# Patient Record
Sex: Male | Born: 1980 | Race: White | Hispanic: No | Marital: Single | Smoking: Current every day smoker
Health system: Southern US, Community
[De-identification: ages and names within clinical notes are randomized; demographics above are authoritative.]

## PROBLEM LIST (undated history)

## (undated) DIAGNOSIS — F1410 Cocaine abuse, uncomplicated: Secondary | ICD-10-CM

## (undated) DIAGNOSIS — F411 Generalized anxiety disorder: Secondary | ICD-10-CM

## (undated) DIAGNOSIS — K649 Unspecified hemorrhoids: Secondary | ICD-10-CM

## (undated) DIAGNOSIS — F988 Other specified behavioral and emotional disorders with onset usually occurring in childhood and adolescence: Secondary | ICD-10-CM

---

## 2002-07-18 ENCOUNTER — Emergency Department (HOSPITAL_COMMUNITY): Admission: EM | Admit: 2002-07-18 | Discharge: 2002-07-19 | Payer: Self-pay | Admitting: *Deleted

## 2002-07-20 ENCOUNTER — Encounter: Admission: RE | Admit: 2002-07-20 | Discharge: 2002-07-20 | Payer: Self-pay | Admitting: Family Medicine

## 2002-07-20 ENCOUNTER — Encounter: Payer: Self-pay | Admitting: Family Medicine

## 2003-11-27 DIAGNOSIS — Z8619 Personal history of other infectious and parasitic diseases: Secondary | ICD-10-CM

## 2003-11-27 HISTORY — DX: Personal history of other infectious and parasitic diseases: Z86.19

## 2003-12-12 ENCOUNTER — Ambulatory Visit (HOSPITAL_BASED_OUTPATIENT_CLINIC_OR_DEPARTMENT_OTHER): Admission: RE | Admit: 2003-12-12 | Discharge: 2003-12-12 | Payer: Self-pay | Admitting: Urology

## 2003-12-12 ENCOUNTER — Encounter (INDEPENDENT_AMBULATORY_CARE_PROVIDER_SITE_OTHER): Payer: Self-pay | Admitting: Specialist

## 2003-12-12 HISTORY — PX: URETHROPLASTY: SHX499

## 2007-10-28 ENCOUNTER — Emergency Department (HOSPITAL_COMMUNITY): Admission: EM | Admit: 2007-10-28 | Discharge: 2007-10-29 | Payer: Self-pay | Admitting: Emergency Medicine

## 2011-03-13 NOTE — Op Note (Signed)
NAME:  [name], Frank Huerta                            ACCOUNT NO.:  [id number]   MEDICAL RECORD NO.:  [id number]                   PATIENT TYPE:  AMB   LOCATION:  [facility]                                 FACILITY:  [facility]   PHYSICIAN:  Sigmund I. Patsi Sears, M.D.         DATE OF BIRTH:  1981-05-09   DATE OF PROCEDURE:  12/12/2003  DATE OF DISCHARGE:                                 OPERATIVE REPORT   PREOPERATIVE DIAGNOSIS:  Urethral sub meatal wart.   POSTOPERATIVE DIAGNOSIS:  Urethral sub meatal wart.   OPERATION:  1. Meatotomy.  2. Distal urethral excision of large wart.  3. Cystoscopy.  4. Urethroplasty, Byars flaps.   SURGEON:  Sigmund I. Patsi Sears, M.D.   ASSISTANTTyson Alias, N.P.-C.   PREPARATION:  After appropriate preanesthesia, the patient is brought to the  operating room and placed on the operating table in dorsal supine position  where general LMA anesthesia was introduced.  He remained in this position  where the pubis was prepped with Betadine solution and draped in the usual  fashion.   HISTORY:  This 30 year old male (see office note of November 28, 2003), has a  large intraurethral wart, in the sub meatal area, which can be identified by  peeling back the glans but cannot be completely exposed by this.  It was  felt that the patient needs meatotomy and excision of the wart.  He was seen  in second opinion by Dr. Etta Grandchild, who was in agreement with surgical plans  after consideration of all nonoperative treatments.   DESCRIPTION OF PROCEDURE:  After prepping and draping of the penis,  meatotomy was made, and urethra was incised.  Four retracting sutures are  placed in the glans, and bleeding was corrected with the electrosurgical  unit and sponging.  A very large wart was identified on both sides of the  urethra, requiring wide excision of the urethra at the level of the sub  meatal urethra.  Urethroplasty was then accomplished with 5-0 Monocryl  sutures with simple  interrupted sutures.  Following this, cystoscopy was  accomplished and showed no evidence of wart anywhere else in the urethra or  the bladder.  There was clear efflux in the orifices, and there was no  evidence of bladder outlet obstruction.  There was no trabeculation, no  cellules, no diverticular formation, no stones, and no tumors.   A 16 French silicone catheter was inserted in the bladder.  I considered  suprapubic catheterization but elected for a short course of Foley  catheterization postoperatively.  Following Foley catheter insertion,  meatoplasty was accomplished around the Foley, and Byars flaps were then  brought back around the penis.  Frenular incision had previously been  accomplished at the beginning of the case, and the frenulum was then  reattached with a 5-0 Vicryl  suture.  The patient had an excellent-appearing penis at the end of the  procedure, and Neosporin  ointment was placed on the meatus and the external  incision.  Sterile dressing was applied.  The patient was given IV Toradol  and awakened and taken to the recovery room in good condition.                                               Sigmund I. Patsi Sears, M.D.    SIT/MEDQ  D:  12/12/2003  T:  12/12/2003  Job:  [id number]

## 2017-04-06 ENCOUNTER — Ambulatory Visit
Admission: RE | Admit: 2017-04-06 | Discharge: 2017-04-06 | Disposition: A | Discharge status: Home / Self Care | Payer: No Typology Code available for payment source | Source: Ambulatory Visit | Attending: Internal Medicine | Admitting: Internal Medicine

## 2017-04-06 ENCOUNTER — Other Ambulatory Visit: Payer: Self-pay | Admitting: Internal Medicine

## 2017-04-06 DIAGNOSIS — R0789 Other chest pain: Secondary | ICD-10-CM

## 2017-04-06 DIAGNOSIS — M545 Low back pain: Secondary | ICD-10-CM

## 2017-05-01 ENCOUNTER — Encounter (HOSPITAL_COMMUNITY): Payer: Self-pay

## 2017-05-01 ENCOUNTER — Emergency Department (HOSPITAL_COMMUNITY): Payer: Self-pay

## 2017-05-01 ENCOUNTER — Emergency Department (HOSPITAL_COMMUNITY)
Admission: EM | Admit: 2017-05-01 | Discharge: 2017-05-01 | Disposition: A | Discharge status: Home / Self Care | Payer: Self-pay | Attending: Emergency Medicine | Admitting: Emergency Medicine

## 2017-05-01 DIAGNOSIS — R079 Chest pain, unspecified: Secondary | ICD-10-CM | POA: Insufficient documentation

## 2017-05-01 DIAGNOSIS — R002 Palpitations: Secondary | ICD-10-CM | POA: Insufficient documentation

## 2017-05-01 DIAGNOSIS — R42 Dizziness and giddiness: Secondary | ICD-10-CM | POA: Insufficient documentation

## 2017-05-01 DIAGNOSIS — F17290 Nicotine dependence, other tobacco product, uncomplicated: Secondary | ICD-10-CM | POA: Insufficient documentation

## 2017-05-01 LAB — BASIC METABOLIC PANEL
ANION GAP: 12 (ref 5–15)
BUN: 9 mg/dL (ref 6–20)
CALCIUM: 9.5 mg/dL (ref 8.9–10.3)
CO2: 23 mmol/L (ref 22–32)
CREATININE: 1.11 mg/dL (ref 0.61–1.24)
Chloride: 102 mmol/L (ref 101–111)
Glucose, Bld: 108 mg/dL — ABNORMAL HIGH (ref 65–99)
Potassium: 5.1 mmol/L (ref 3.5–5.1)
SODIUM: 137 mmol/L (ref 135–145)

## 2017-05-01 LAB — I-STAT TROPONIN, ED
TROPONIN I, POC: 0 ng/mL (ref 0.00–0.08)
Troponin i, poc: 0 ng/mL (ref 0.00–0.08)

## 2017-05-01 LAB — CBC
HCT: 44.7 % (ref 39.0–52.0)
HEMOGLOBIN: 15.4 g/dL (ref 13.0–17.0)
MCH: 32.5 pg (ref 26.0–34.0)
MCHC: 34.5 g/dL (ref 30.0–36.0)
MCV: 94.3 fL (ref 78.0–100.0)
PLATELETS: 212 10*3/uL (ref 150–400)
RBC: 4.74 MIL/uL (ref 4.22–5.81)
RDW: 13.5 % (ref 11.5–15.5)
WBC: 11.3 10*3/uL — AB (ref 4.0–10.5)

## 2017-05-01 LAB — RAPID URINE DRUG SCREEN, HOSP PERFORMED
Amphetamines: NOT DETECTED
BENZODIAZEPINES: NOT DETECTED
Barbiturates: NOT DETECTED
COCAINE: POSITIVE — AB
OPIATES: NOT DETECTED
Tetrahydrocannabinol: NOT DETECTED

## 2017-05-01 MED ORDER — NITROGLYCERIN 0.4 MG/SPRAY TL SOLN
1.0000 | Freq: Once | Status: AC
  Administered 2017-05-01: 1 via SUBLINGUAL
  Filled 2017-05-01 (×2): qty 4.9

## 2017-05-01 NOTE — ED Notes (Signed)
Patient transported to X-ray 

## 2017-05-01 NOTE — ED Notes (Signed)
Pt updated to plan of care and delay. Plan to draw troponin at approx 10:15 PM.

## 2017-05-01 NOTE — Discharge Instructions (Signed)
Refrain from using cocaine again as it could cause the symptoms to return, causing to have a heart attack, or even death. Please return to the emergency department if you develop any new or worsening symptoms.

## 2017-05-01 NOTE — ED Notes (Signed)
ED Provider at bedside. 

## 2017-05-01 NOTE — ED Notes (Signed)
Per Morrie SheldonAshley, lab tech, tech ran blood gas instead of I stat trop @ 1624, need to re-draw lab. Advised Bernell ListKaleigh Brown RN.

## 2017-05-01 NOTE — ED Provider Notes (Signed)
Patient continues to feel well and Dr. baseline. Sign out from Mathews RobinsonsJessica Mitchell, PA-C  Patient is a previously healthy 36 year old male who presents with left-sided chest pain and numbness from his left neck to hip after using cocaine earlier this morning from midnight to 4 AM. Patient symptoms began around 10 AM. Patient was given nitroglycerin in route and in the ED. Initial troponin is negative. Other labs are unremarkable. UDS shows cocaine, but no other findings. Delta troponin is pending as well as observation of the patient's symptoms.  On my evaluation, patient's symptoms are resolved and he is feeling back to baseline. After anticipated negative delta troponin, plan to discharge patient home.   10:30p delta troponin is negative. Patient continues to feel well and back to baseline. Patient advised not to use cocaine or other drugs anymore, and he states he will never use drugs again. Return precautions discussed. Patient understands and agrees with plan. Patient vitals stable to ED course discharged in satisfactory condition.   Emi HolesLaw, Wang Granada M, PA-C 05/01/17 2229    Clarene DukeLittle, Ambrose Finlandachel Morgan, MD 05/02/17 [phone]

## 2017-05-01 NOTE — ED Triage Notes (Signed)
Pt via EMS with L sided chest tightness radiating to L shoulder x 1 hour. Pt reports approx 0.5g of cocaine use this morning between 12-4 AM and has not been asleep since. Pt A&Ox4. Denies SOB, N/V. Pt reports lightheadedness and reports he feels as though "he might pass out". 180/88, 125 bpm, 96% on RA, RR 18. 324 ASA and 1 NTG en route with no relief.

## 2017-05-01 NOTE — ED Provider Notes (Signed)
[facility]-EMERGENCY DEPT Provider Note   CSN: [id number] Arrival date & time: 05/01/17  1613     History   Chief Complaint Chief Complaint  Patient presents with  . Chest Pain    HPI Frank Huerta is a 36 y.o. male with no past medical history presenting via EMS with sudden onset chest pressure since 1:00 this afternoon. Patient reports that he was using cocaine from 12 to 4 AM this morning. He did not sleep and around 10 AM he felt lightheaded started having a burning sensation on his left chest and torso up to his neck. He then began to experience palpitations. He reports having had constant chest pressure like someone is sitting on his chest since 1 PM. He did not get any relief from nitroglycerin by EMS. He states that he feels like he can't lift his left arm all the way up without pain. Patient reports having had cocaine in the past but never experiencing any symptoms like this. Denies associated diaphoresis, nausea, vomiting, difficulty breathing.  HPI  History reviewed. No pertinent past medical history.  There are no active problems to display for this patient.   No past surgical history on file.     Home Medications    Prior to Admission medications   Not on File    Family History No family history on file.  Social History Social History  Substance Use Topics  . Smoking status: Current Every Day Smoker    Types: E-cigarettes  . Smokeless tobacco: Never Used  . Alcohol use Yes     Comment: three times weekly     Allergies   Patient has no known allergies.   Review of Systems Review of Systems  Constitutional: Negative for chills and fever.  HENT: Negative for ear pain and sore throat.   Eyes: Negative for pain and visual disturbance.  Respiratory: Positive for chest tightness. Negative for cough and shortness of breath.   Cardiovascular: Positive for chest pain and palpitations. Negative for leg swelling.  Gastrointestinal: Positive for abdominal  distention. Negative for abdominal pain, nausea and vomiting.       Patient reports getting distended every time he does cocaine  Genitourinary: Negative for dysuria and hematuria.  Musculoskeletal: Negative for arthralgias, back pain, neck pain and neck stiffness.  Skin: Negative for color change, pallor and rash.  Neurological: Positive for light-headedness. Negative for dizziness, seizures, syncope, facial asymmetry, speech difficulty, weakness, numbness and headaches.     Physical Exam Updated Vital Signs BP (!) 153/85   Pulse 75   Temp 98.8 F (37.1 C) (Oral)   Resp 17   Ht 6\' 4"  (1.93 m)   Wt 93 kg (205 lb)   SpO2 95%   BMI 24.95 kg/m   Physical Exam  Constitutional: He appears well-developed and well-nourished. No distress.  Afebrile, nontoxic-appearing, lying comfortably in bed in no acute distress.  HENT:  Head: Normocephalic and atraumatic.  Eyes: Conjunctivae and EOM are normal. Pupils are equal, round, and reactive to light.  Neck: Normal range of motion. Neck supple.  Cardiovascular: Normal rate, regular rhythm, normal heart sounds and intact distal pulses.   No murmur heard. Pulmonary/Chest: Effort normal and breath sounds normal. No respiratory distress. He has no wheezes. He has no rales. He exhibits tenderness.  Abdominal: Soft. He exhibits distension. He exhibits no mass. There is no tenderness. There is no rebound and no guarding.  Musculoskeletal: He exhibits tenderness. He exhibits no edema or deformity.  Decreased range of  motion of the left shoulder due to pain patient is unable to live beyond the level of his shoulders. Left chest tenderness  Neurological: He is alert.  Skin: Skin is warm and dry. No rash noted. He is not diaphoretic. No erythema. No pallor.  Psychiatric: He has a normal mood and affect.  Nursing note and vitals reviewed.    ED Treatments / Results  Labs (all labs ordered are listed, but only abnormal results are displayed) Labs  Reviewed  BASIC METABOLIC PANEL - Abnormal; Notable for the following:       Result Value   Glucose, Bld 108 (*)    All other components within normal limits  CBC - Abnormal; Notable for the following:    WBC 11.3 (*)    All other components within normal limits  RAPID URINE DRUG SCREEN, HOSP PERFORMED  I-STAT TROPOININ, ED    EKG  EKG Interpretation  Date/Time:  Saturday May 01 2017 16:22:40 EDT Ventricular Rate:  90 PR Interval:    QRS Duration: 81 QT Interval:  344 QTC Calculation: 421 R Axis:   84 Text Interpretation:  Sinus rhythm Probable left atrial enlargement T wave inversion III no previous available Confirmed by Frederick Peers [phone]) on 05/01/2017 6:04:58 PM       Radiology Dg Chest 2 View  Result Date: 05/01/2017 CLINICAL DATA:  Chest pressure and pain. EXAM: CHEST  2 VIEW COMPARISON:  April 06, 2017 FINDINGS: The heart size and mediastinal contours are within normal limits. Both lungs are clear. The visualized skeletal structures are unremarkable. IMPRESSION: No active cardiopulmonary disease. Electronically Signed   By: Gerome Sam III M.D   On: 05/01/2017 18:35    Procedures Procedures (including critical care time)  Medications Ordered in ED Medications  nitroGLYCERIN (NITROLINGUAL) 0.4 MG/SPRAY spray 1 spray (1 spray Sublingual Given 05/01/17 1841)     Initial Impression / Assessment and Plan / ED Course  I have reviewed the triage vital signs and the nursing notes.  Pertinent labs & imaging results that were available during my care of the patient were reviewed by me and considered in my medical decision making (see chart for details).    Patient presents with chest pain possibly vasospastic angina from cocaine use. Although his last intake was > 6 hours prior to onset of symptoms.  No past medical history, otherwise healthy and in his usual state of health until this episode. Patient denies any prior episode after cocaine use. Heart score:  1 Initial troponin negative.  Patient reported improvement in symptoms while in ED.  Transferred patient care at end of shift to Glenford Bayley, PA-C pending delta troponin. Anticipate discharge home if negative. Encourage substance abuse counseling.   Final Clinical Impressions(s) / ED Diagnoses   Final diagnoses:  Nonspecific chest pain    New Prescriptions New Prescriptions   No medications on file     Gregary Cromer 05/01/17 2038    Little, Ambrose Finland, MD 05/02/17 [phone]

## 2017-09-30 ENCOUNTER — Emergency Department (HOSPITAL_COMMUNITY)
Admission: EM | Admit: 2017-09-30 | Discharge: 2017-09-30 | Disposition: A | Discharge status: Home / Self Care | Payer: Self-pay | Attending: Physician Assistant | Admitting: Physician Assistant

## 2017-09-30 ENCOUNTER — Emergency Department (HOSPITAL_COMMUNITY): Payer: Self-pay

## 2017-09-30 ENCOUNTER — Encounter (HOSPITAL_COMMUNITY): Payer: Self-pay | Admitting: *Deleted

## 2017-09-30 DIAGNOSIS — F909 Attention-deficit hyperactivity disorder, unspecified type: Secondary | ICD-10-CM | POA: Insufficient documentation

## 2017-09-30 DIAGNOSIS — F17200 Nicotine dependence, unspecified, uncomplicated: Secondary | ICD-10-CM | POA: Insufficient documentation

## 2017-09-30 DIAGNOSIS — R0789 Other chest pain: Secondary | ICD-10-CM | POA: Insufficient documentation

## 2017-09-30 DIAGNOSIS — F1410 Cocaine abuse, uncomplicated: Secondary | ICD-10-CM | POA: Insufficient documentation

## 2017-09-30 HISTORY — DX: Cocaine abuse, uncomplicated: F14.10

## 2017-09-30 HISTORY — DX: Other specified behavioral and emotional disorders with onset usually occurring in childhood and adolescence: F98.8

## 2017-09-30 LAB — BASIC METABOLIC PANEL
Anion gap: 13 (ref 5–15)
BUN: 12 mg/dL (ref 6–20)
CALCIUM: 9.4 mg/dL (ref 8.9–10.3)
CHLORIDE: 104 mmol/L (ref 101–111)
CO2: 20 mmol/L — ABNORMAL LOW (ref 22–32)
CREATININE: 0.98 mg/dL (ref 0.61–1.24)
Glucose, Bld: 110 mg/dL — ABNORMAL HIGH (ref 65–99)
Potassium: 3.5 mmol/L (ref 3.5–5.1)
SODIUM: 137 mmol/L (ref 135–145)

## 2017-09-30 LAB — CBC
HCT: 42.1 % (ref 39.0–52.0)
Hemoglobin: 15.2 g/dL (ref 13.0–17.0)
MCH: 33.3 pg (ref 26.0–34.0)
MCHC: 36.1 g/dL — ABNORMAL HIGH (ref 30.0–36.0)
MCV: 92.1 fL (ref 78.0–100.0)
Platelets: 173 10*3/uL (ref 150–400)
RBC: 4.57 MIL/uL (ref 4.22–5.81)
RDW: 13.3 % (ref 11.5–15.5)
WBC: 8.7 10*3/uL (ref 4.0–10.5)

## 2017-09-30 LAB — I-STAT TROPONIN, ED
Troponin i, poc: 0 ng/mL (ref 0.00–0.08)
Troponin i, poc: 0 ng/mL (ref 0.00–0.08)

## 2017-09-30 MED ORDER — SODIUM CHLORIDE 0.9 % IV BOLUS (SEPSIS)
1000.0000 mL | Freq: Once | INTRAVENOUS | Status: AC
  Administered 2017-09-30: 1000 mL via INTRAVENOUS

## 2017-09-30 MED ORDER — ACETAMINOPHEN 325 MG PO TABS
325.0000 mg | ORAL_TABLET | Freq: Once | ORAL | Status: AC
  Administered 2017-09-30: 325 mg via ORAL
  Filled 2017-09-30: qty 1

## 2017-09-30 NOTE — ED Notes (Signed)
Patient requesting to use his own nicotine patch. EDP aware and approved

## 2017-09-30 NOTE — ED Triage Notes (Signed)
Per EMS, pt from home, reports severe L sided cp radiating to his L shoulder and L arm.  Reports doing 0.5gm of cocaine at 0200, started to have severe cp at 0500 and has worsened since.  Pt describes pain as pressure, heavy pounding pain.  Pt reports same happened in July but took more than this am.  Pt is calm and cooperative.  Denies any SOB/dizziness.  Reports mild h/a.  Pt reports laying flat makes pain worse, sitting up makes it better.

## 2017-09-30 NOTE — ED Provider Notes (Signed)
[facility] EMERGENCY DEPARTMENT Provider Note   CSN: [id number] Arrival date & time: 09/30/17  1143     History   Chief Complaint Chief Complaint  Patient presents with  . Chest Pain  . cocaine abuse    HPI Frank Huerta is a 36 y.o. male.  HPI  Mr. Frank Huerta is a 36 year old male with a history of cocaine abuse, ADHD who presents emergency department for evaluation of left-sided chest pain after using cocaine earlier today.  Patient states that he used 0.5 g of cocaine around 2AM today.  Reports that he began having severe 8/10  left-sided chest pain at approximately 5AM which radiates to the left arm.  Pain feels like a "thumping sensation in my chest."  Pain is worsened with lying flat or with lifting the left arm above the head.  Denies associated shortness of breath, nausea/vomiting, diaphoresis.  States that his left arm feels tingly, but denies loss of sensation.  He has not tried any medications to help alleviate the pain.  States that his symptoms are identical to the symptoms he had in July when he abused cocaine. He is asking for IV fluids.  He denies weakness, dysarthria, dysphagia, difficulty walking, visual disturbance.   Past Medical History:  Diagnosis Date  . Attention deficit disorder (ADD)   . Cocaine abuse (HCC)     There are no active problems to display for this patient.   History reviewed. No pertinent surgical history.     Home Medications    Prior to Admission medications   Not on File    Family History No family history on file.  Social History Social History   Tobacco Use  . Smoking status: Current Every Day Smoker    Types: E-cigarettes  . Smokeless tobacco: Never Used  Substance Use Topics  . Alcohol use: Yes    Comment: three times weekly  . Drug use: Yes    Types: Cocaine     Allergies   Patient has no known allergies.   Review of Systems Review of Systems  Constitutional: Negative for chills, fatigue and  fever.  HENT: Negative for trouble swallowing.   Eyes: Negative for visual disturbance.  Respiratory: Negative for cough and shortness of breath.   Cardiovascular: Positive for chest pain and palpitations. Negative for leg swelling.  Gastrointestinal: Negative for abdominal pain, nausea and vomiting.  Genitourinary: Negative for difficulty urinating.  Musculoskeletal: Positive for myalgias (left arm pain). Negative for gait problem.  Skin: Negative for rash.  Neurological: Negative for dizziness, speech difficulty, weakness, light-headedness, numbness (tingling sensation in left arm, no loss of sensation) and headaches.  Psychiatric/Behavioral: Negative for agitation.     Physical Exam Updated Vital Signs BP 131/84   Pulse 74   Temp 98.3 F (36.8 C) (Oral)   Resp 19   Ht 6\' 4"  (1.93 m)   Wt 93 kg (205 lb)   SpO2 98%   BMI 24.95 kg/m   Physical Exam  Constitutional: He is oriented to person, place, and time. He appears well-developed and well-nourished. No distress.  HENT:  Head: Normocephalic and atraumatic.  Eyes: EOM are normal. Pupils are equal, round, and reactive to light. Right eye exhibits no discharge. Left eye exhibits no discharge.  Neck: Normal range of motion. Neck supple. No JVD present. No tracheal deviation present.  Cardiovascular: Normal rate and regular rhythm. Exam reveals no friction rub.  No murmur heard. Pulmonary/Chest: Effort normal and breath sounds normal. No respiratory distress.  He has no wheezes. He has no rhonchi. He has no rales.  Tender to palpation grossly over the left chest wall, no overlying rash, erythema or ecchymosis.   Abdominal: Soft. Bowel sounds are normal. He exhibits no distension and no mass. There is no tenderness. There is no guarding.  Musculoskeletal: Normal range of motion.       Right lower leg: Normal. He exhibits no tenderness and no edema.       Left lower leg: Normal. He exhibits no tenderness and no edema.    Neurological: He is alert and oriented to person, place, and time. Coordination normal.  Mental Status:  Alert, oriented, thought content appropriate, able to give a coherent history. Speech fluent without evidence of aphasia. Able to follow 2 step commands without difficulty.  Cranial Nerves:  II:  Peripheral visual fields grossly normal, pupils equal, round, reactive to light III,IV, VI: ptosis not present, extra-ocular motions intact bilaterally  V,VII: smile symmetric, facial light touch sensation equal VIII: hearing grossly normal to voice  X: uvula elevates symmetrically  XI: bilateral shoulder shrug symmetric and strong XII: midline tongue extension without fassiculations Motor:  Normal tone. 5/5 in upper and lower extremities bilaterally including strong and equal grip strength and dorsiflexion/plantar flexion Sensory: Pinprick and light touch normal in all extremities.  Deep Tendon Reflexes: 2+ and symmetric in the biceps and patella Cerebellar: normal finger-to-nose with bilateral upper extremities   Skin: Skin is warm and dry. Capillary refill takes less than 2 seconds. He is not diaphoretic.  Psychiatric: He has a normal mood and affect. His behavior is normal.  Nursing note and vitals reviewed.    ED Treatments / Results  Labs (all labs ordered are listed, but only abnormal results are displayed) Labs Reviewed  CBC - Abnormal; Notable for the following components:      Result Value   MCHC 36.1 (*)    All other components within normal limits  BASIC METABOLIC PANEL  I-STAT TROPONIN, ED    EKG  EKG Interpretation None       Radiology No results found.  Procedures Procedures (including critical care time)  Medications Ordered in ED Medications - No data to display   Initial Impression / Assessment and Plan / ED Course  I have reviewed the triage vital signs and the nursing notes.  Pertinent labs & imaging results that were available during my care of  the patient were reviewed by me and considered in my medical decision making (see chart for details).  Clinical Course as of Sep 30 1534  Thu Sep 30, 2017  1511 On recheck patient states his chest pain is improved, but has not totally resolved. Waiting on second troponin.  [ES]  1529 Patient asking for food  [ES]    Clinical Course User Index [ES] Kellie ShropshireShrosbree, Damilola Flamm J, PA-C   Patient presents with left-sided chest and arm pain after using cocaine earlier this morning. He is in no acute distress and VSS. No neurological deficits. Will evaluate for possible vasospastic angina from cocaine use.   Delta troponin negative, EKG's nonischemic. CBC and BMP unremarkable. Patient's symptoms improved while in the ER. HEART score 1. Low concern for ACS given these results and subsequent improvement in patients chest pain while in the ER.   Have counseled patient on substance use and the risks of serious health complications including death with cocaine use. Discussed strict return precautions and patient agrees and voices understanding. Discussed this patient with Dr. Corlis LeakMackuen who agrees  with the plan to discharge this patient. Patient has no complaints prior to discharge.   Final Clinical Impressions(s) / ED Diagnoses   Final diagnoses:  Other chest pain  Cocaine abuse [facility](HCC)    ED Discharge Orders    None       Kellie ShropshireShrosbree, Sheretha Shadd J, PA-C 09/30/17 1706    Abelino DerrickMackuen, Courteney Lyn, MD 10/03/17 [phone]

## 2017-09-30 NOTE — Discharge Instructions (Addendum)
Your labwork, chest xray and EKG were reassuring today.   Please return to the Emergency Department if you have worsening chest pain with shortness of breath, chest pain with nausea/vomiting, chest pain where you are breaking out into a sweat or you have any new or worsening symptoms.

## 2017-09-30 NOTE — ED Notes (Signed)
Family at bedside. 

## 2017-09-30 NOTE — ED Notes (Signed)
Pt is resting and appears comfortable.  Reports his pain is much better.  Family remains at bedside.

## 2017-09-30 NOTE — ED Notes (Signed)
Patient requesting pain medicine. Will notify edp

## 2018-06-22 ENCOUNTER — Ambulatory Visit: Payer: Self-pay | Admitting: Physician Assistant

## 2018-06-22 ENCOUNTER — Encounter: Payer: Self-pay | Admitting: Physician Assistant

## 2018-06-22 ENCOUNTER — Other Ambulatory Visit: Payer: Self-pay

## 2018-06-22 VITALS — BP 135/78 | HR 86 | Temp 98.5°F | Resp 18 | Ht 76.0 in | Wt 202.2 lb

## 2018-06-22 DIAGNOSIS — A6001 Herpesviral infection of penis: Secondary | ICD-10-CM

## 2018-06-22 DIAGNOSIS — R360 Urethral discharge without blood: Secondary | ICD-10-CM

## 2018-06-22 DIAGNOSIS — R369 Urethral discharge, unspecified: Secondary | ICD-10-CM

## 2018-06-22 MED ORDER — AZITHROMYCIN 250 MG PO TABS: ORAL_TABLET | ORAL | 0 refills | Status: DC

## 2018-06-22 MED ORDER — VALACYCLOVIR HCL 1 G PO TABS: 1000.0000 mg | ORAL_TABLET | Freq: Two times a day (BID) | ORAL | 2 refills | Status: AC

## 2018-06-22 MED ORDER — CEFTRIAXONE SODIUM 250 MG IJ SOLR
250.0000 mg | Freq: Once | INTRAMUSCULAR | Status: AC
  Administered 2018-06-22: 250 mg via INTRAMUSCULAR

## 2018-06-22 NOTE — Progress Notes (Signed)
06/22/2018 11:59 AM   DOB: 09-Mar-1981 / MRN: [id number]  SUBJECTIVE:  Frank Huerta is a 37 y.o. male presenting for penile discharge and penile rash.  Symptoms present for about three days.  The problem is is getting worse. He has tried benadryl cream.  No dysuria or abdominal pain.  Tells me he had unprotected sex with two different male partners last week.    He has No Known Allergies.   He  has a past medical history of Attention deficit disorder (ADD) and Cocaine abuse (HCC).    He  reports that he has been smoking e-cigarettes. He has never used smokeless tobacco. He reports that he drinks alcohol. He reports that he has current or past drug history. Drug: Cocaine. He  has no sexual activity history on file. The patient  has no past surgical history on file.  His family history is not on file.  Review of Systems  Constitutional: Negative for chills and fever.  Genitourinary: Negative for dysuria, flank pain, frequency, hematuria and urgency.  Skin: Positive for rash. Negative for itching.  Neurological: Negative for dizziness.    The problem list and medications were reviewed and updated by myself where necessary and exist elsewhere in the encounter.   OBJECTIVE:  BP 135/78   Pulse 86   Temp 98.5 F (36.9 C) (Oral)   Resp 18   Ht 6\' 4"  (1.93 m)   Wt 202 lb 3.2 oz (91.7 kg)   SpO2 94%   BMI 24.61 kg/m   Wt Readings from Last 3 Encounters:  06/22/18 202 lb 3.2 oz (91.7 kg)  09/30/17 205 lb (93 kg)  05/01/17 205 lb (93 kg)   Temp Readings from Last 3 Encounters:  06/22/18 98.5 F (36.9 C) (Oral)  09/30/17 98.3 F (36.8 C) (Oral)  05/01/17 98.1 F (36.7 C) (Oral)   BP Readings from Last 3 Encounters:  06/22/18 135/78  09/30/17 (!) 148/73  05/01/17 (!) 141/87   Pulse Readings from Last 3 Encounters:  06/22/18 86  09/30/17 83  05/01/17 70    Physical Exam  Constitutional: He is oriented to person, place, and time. He appears well-developed. He does not  appear ill.  Eyes: Pupils are equal, round, and reactive to light. Conjunctivae and EOM are normal.  Cardiovascular: Normal rate.  Pulmonary/Chest: Effort normal.  Abdominal: He exhibits no distension. Hernia confirmed negative in the right inguinal area and confirmed negative in the left inguinal area.  Genitourinary: Right testis shows no mass, no swelling and no tenderness. Right testis is descended. Cremasteric reflex is not absent on the right side. Left testis shows no mass, no swelling and no tenderness. Left testis is descended. Cremasteric reflex is not absent on the left side.     Musculoskeletal: Normal range of motion.  Lymphadenopathy: No inguinal adenopathy noted on the right or left side.  Neurological: He is alert and oriented to person, place, and time. No cranial nerve deficit. Coordination normal.  Skin: Skin is warm and dry. He is not diaphoretic.  Psychiatric: He has a normal mood and affect.  Nursing note and vitals reviewed.   No results found for: HGBA1C  Lab Results  Component Value Date   WBC 8.7 09/30/2017   HGB 15.2 09/30/2017   HCT 42.1 09/30/2017   MCV 92.1 09/30/2017   PLT 173 09/30/2017    Lab Results  Component Value Date   CREATININE 0.98 09/30/2017   BUN 12 09/30/2017   NA 137 09/30/2017  K 3.5 09/30/2017   CL 104 09/30/2017   CO2 20 (L) 09/30/2017    ASSESSMENT AND PLAN:  Cleon Gustinshby was seen today for exposure to std.  Diagnoses and all orders for this visit:  Abnormal penile discharge, without blood -     GC/Chlamydia Probe Amp -     HIV antibody -     RPR -     Trichomonas vaginalis, RNA -     cefTRIAXone (ROCEPHIN) injection 250 mg -     azithromycin (ZITHROMAX) 250 MG tablet; Take 4 tabs once with food.  Type 2 HSV infection of penis -     valACYclovir (VALTREX) 1000 MG tablet; Take 1 tablet (1,000 mg total) by mouth 2 (two) times daily.    The patient is advised to call or return to clinic if he does not see an improvement  in symptoms, or to seek the care of the closest emergency department if he worsens with the above plan.   Deliah BostonMichael Sunny Gains, MHS, PA-C Primary Care at [facility] 06/22/2018 11:59 AM

## 2018-06-22 NOTE — Patient Instructions (Addendum)
  Follow the instructions on the pill bottles. Come back if you have recurrent symptoms.     If you have lab work done today you will be contacted with your lab results within the next 2 weeks.  If you have not heard from us then please contact us. The fastest way to get your results is to register for My Chart.   IF you received an x-ray today, you will receive an invoice from [facility]. Please contact [facility] at [phone] with questions or concerns regarding your invoice.   IF you received labwork today, you will receive an invoice from [facility]. Please contact [facility] at [phone] with questions or concerns regarding your invoice.   Our billing staff will not be able to assist you with questions regarding bills from these companies.  You will be contacted with the lab results as soon as they are available. The fastest way to get your results is to activate your My Chart account. Instructions are located on the last page of this paperwork. If you have not heard from us regarding the results in 2 weeks, please contact this office.

## 2018-06-23 LAB — GC/CHLAMYDIA PROBE AMP
Chlamydia trachomatis, NAA: NEGATIVE
NEISSERIA GONORRHOEAE BY PCR: NEGATIVE

## 2018-06-23 LAB — RPR: RPR Ser Ql: NONREACTIVE

## 2018-06-23 LAB — HIV ANTIBODY (ROUTINE TESTING W REFLEX): HIV Screen 4th Generation wRfx: NONREACTIVE

## 2018-06-23 LAB — TRICHOMONAS VAGINALIS, PROBE AMP: Trich vag by NAA: NEGATIVE

## 2018-06-25 ENCOUNTER — Encounter: Payer: Self-pay | Admitting: Radiology

## 2018-07-01 ENCOUNTER — Encounter: Payer: Self-pay | Admitting: Urgent Care

## 2018-07-01 ENCOUNTER — Ambulatory Visit: Payer: Self-pay | Admitting: Urgent Care

## 2018-07-01 VITALS — BP 138/87 | HR 66 | Temp 97.9°F | Resp 18 | Ht 76.0 in | Wt 208.4 lb

## 2018-07-01 DIAGNOSIS — R369 Urethral discharge, unspecified: Secondary | ICD-10-CM

## 2018-07-01 DIAGNOSIS — R360 Urethral discharge without blood: Secondary | ICD-10-CM

## 2018-07-01 DIAGNOSIS — A6001 Herpesviral infection of penis: Secondary | ICD-10-CM

## 2018-07-01 NOTE — Patient Instructions (Addendum)
For another outbreak, then take 5 pills of 1,000mg  Valtrex for 5 days. Call for additional refills of Valtrex.    Genital Herpes Genital herpes is a common sexually transmitted infection (STI) that is caused by a virus. The virus spreads from person to person through sexual contact. Infection can cause itching, blisters, and sores around the genitals or rectum. Symptoms may last several days and then go away This is called an outbreak. However, the virus remains in your body, so you may have more outbreaks in the future. The time between outbreaks varies and can be months or years. Genital herpes affects men and women. It is particularly concerning for pregnant women because the virus can be passed to the baby during delivery and can cause serious problems. Genital herpes is also a concern for people who have a weak disease-fighting (immune) system. What are the causes? This condition is caused by the herpes simplex virus (HSV) type 1 or type 2. The virus may spread through:  Sexual contact with an infected person, including vaginal, anal, and oral sex.  Contact with fluid from a herpes sore.  The skin. This means that you can get herpes from an infected partner even if he or she does not have a visible sore or does not know that he or she is infected.  What increases the risk? You are more likely to develop this condition if:  You have sex with many partners.  You do not use latex condoms during sex.  What are the signs or symptoms? Most people do not have symptoms (asymptomatic) or have mild symptoms that may be mistaken for other skin problems. Symptoms may include:  Small red bumps near the genitals, rectum, or mouth. These bumps turn into blisters and then turn into sores.  Flu-like symptoms, including: ? Fever. ? Body aches. ? Swollen lymph nodes. ? Headache.  Painful urination.  Pain and itching in the genital area or rectal area.  Vaginal discharge.  Tingling or  shooting pain in the legs and buttocks.  Generally, symptoms are more severe and last longer during the first (primary) outbreak. Flu-like symptoms are also more common during the primary outbreak. How is this diagnosed? Genital herpes may be diagnosed based on:  A physical exam.  Your medical history.  Blood tests.  A test of a fluid sample (culture) from an open sore.  How is this treated? There is no cure for this condition, but treatment with antiviral medicines that are taken by mouth (orally) can do the following:  Speed up healing and relieve symptoms.  Help to reduce the spread of the virus to sexual partners.  Limit the chance of future outbreaks, or make future outbreaks shorter.  Lessen symptoms of future outbreaks.  Your health care provider may also recommend pain relief medicines, such as aspirin or ibuprofen. Follow these instructions at home: Sexual activity  Do not have sexual contact during active outbreaks.  Practice safe sex. Latex condoms and male condoms may help prevent the spread of the herpes virus. General instructions  Keep the affected areas dry and clean.  Take over-the-counter and prescription medicines only as told by your health care provider.  Avoid rubbing or touching blisters and sores. If you do touch blisters or sores: ? Wash your hands thoroughly with soap and water. ? Do not touch your eyes afterward.  To help relieve pain or itching, you may take the following actions as directed by your health care provider: ? Apply a cold, wet  cloth (cold compress) to affected areas 4-6 times a day. ? Apply a substance that protects your skin and reduces bleeding (astringent). ? Apply a gel that helps relieve pain around sores (lidocaine gel). ? Take a warm, shallow bath that cleans the genital area (sitz bath).  Keep all follow-up visits as told by your health care provider. This is important. How is this prevented?  Use condoms. Although  anyone can get genital herpes during sexual contact, even with the use of a condom, a condom can provide some protection.  Avoid having multiple sexual partners.  Talk with your sexual partner about any symptoms either of you may have. Also, talk with your partner about any history of STIs.  Get tested for STIs before you have sex. Ask your partner to do the same.  Do not have sexual contact if you have symptoms of genital herpes. Contact a health care provider if:  Your symptoms are not improving with medicine.  Your symptoms return.  You have new symptoms.  You have a fever.  You have abdominal pain.  You have redness, swelling, or pain in your eye.  You notice new sores on other parts of your body.  You are a woman and experience bleeding between menstrual periods.  You have had herpes and you become pregnant or plan to become pregnant. Summary  Genital herpes is a common sexually transmitted infection (STI) that is caused by the herpes simplex virus (HSV) type 1 or type 2.  These viruses are most often spread through sexual contact with an infected person.  You are more likely to develop this condition if you have sex with many partners or you have unprotected sex.  Most people do not have symptoms (asymptomatic) or have mild symptoms that may be mistaken for other skin problems. Symptoms occur as outbreaks that may happen months or years apart.  There is no cure for this condition, but treatment with oral antiviral medicines can reduce symptoms, reduce the chance of spreading the virus to a partner, prevent future outbreaks, or shorten future outbreaks. This information is not intended to replace advice given to you by your health care provider. Make sure you discuss any questions you have with your health care provider. Document Released: 10/09/2000 Document Revised: 09/11/2016 Document Reviewed: 09/11/2016 Elsevier Interactive Patient Education  [employer].    If you have lab work done today you will be contacted with your lab results within the next 2 weeks.  If you have not heard from [place] then please contact us. The fastest way to get your results is to register for My Chart.   IF you received an x-ray today, you will receive an invoice from [facility]. Please contact [facility] at [phone] with questions or concerns regarding your invoice.   IF you received labwork today, you will receive an invoice from [place]. Please contact [facility] at [phone] with questions or concerns regarding your invoice.   Our billing staff will not be able to assist you with questions regarding bills from these companies.  You will be contacted with the lab results as soon as they are available. The fastest way to get your results is to activate your My Chart account. Instructions are located on the last page of this paperwork. If you have not heard from [place] regarding the results in 2 weeks, please contact this office.

## 2018-07-01 NOTE — Progress Notes (Signed)
    MRN: [id number] DOB: 08/27/1981  Subjective:   Frank Huerta is a 37 y.o. male presenting for refill on Valtrex.  He was last seen 06/22/2018 and given clinical diagnosis of HSV-2 infection from a genital rash on his penis.  Today he reports that the pain is getting better but needs a refill of Valtrex.  Megan has a current medication list which includes the following prescription(s): amphetamine-dextroamphetamine and valacyclovir. Also has No Known Allergies.    Objective:   Vitals: BP 138/87   Pulse 66   Temp 97.9 F (36.6 C) (Oral)   Resp 18   Ht 6\' 4"  (1.93 m)   Wt 208 lb 6.4 oz (94.5 kg)   SpO2 97%   BMI 25.37 kg/m   Physical Exam  Genitourinary:       Assessment and Plan :   Type 2 HSV infection of penis - Plan: Herpes simplex virus culture  Abnormal penile discharge, without blood  I went ahead and did an HSV culture today given that patient had remnants of his HSV lesion.  Counseled that he should continue to take Valtrex in the future for herpes type outbreaks.  Labs pending.  Wallis Bamberg, PA-C Primary Care at [facility] [phone] 07/01/2018  2:04 PM

## 2018-07-04 LAB — HERPES SIMPLEX VIRUS CULTURE

## 2018-11-14 ENCOUNTER — Other Ambulatory Visit: Payer: Self-pay | Admitting: Internal Medicine

## 2018-11-14 DIAGNOSIS — A6001 Herpesviral infection of penis: Secondary | ICD-10-CM

## 2018-11-14 NOTE — Telephone Encounter (Signed)
Patient called, left VM to return call to the office to schedule an appointment with a new provider in order to receive medication refill.

## 2018-11-14 NOTE — Addendum Note (Signed)
Addended by: Wilford CornerOVINGTON, Lisbet Busker W on: 11/14/2018 05:45 PM   Modules accepted: Orders

## 2018-11-14 NOTE — Telephone Encounter (Signed)
Copied from CRM [id number]. Topic: Quick Communication - Rx Refill/Question >> Nov 14, 2018  5:12 PM Jilda Roche wrote: Medication: valACYclovir (VALTREX) 1000 MG tablet     Has the patient contacted their pharmacy? No. (Agent: If no, request that the patient contact the pharmacy for the refill.) (Agent: If yes, when and what did the pharmacy advise?)  Preferred Pharmacy (with phone number or street name): [id number] - [place], [place] - [address] [facility] [address] [phone] (Phone) [phone] (Fax)    Agent: Please be advised that RX refills may take up to 3 business days. We ask that you follow-up with your pharmacy.

## 2019-04-11 IMAGING — DX DG CHEST 2V
2 series · 2 of 2 positions shown · non-contrast
Comparison: 05/01/2017

CLINICAL DATA: Left-sided chest pain radiating to the left shoulder
and left arm. Admits to recent cocaine use and 0700 hours.

EXAM:
CHEST  2 VIEW

[chest lat]
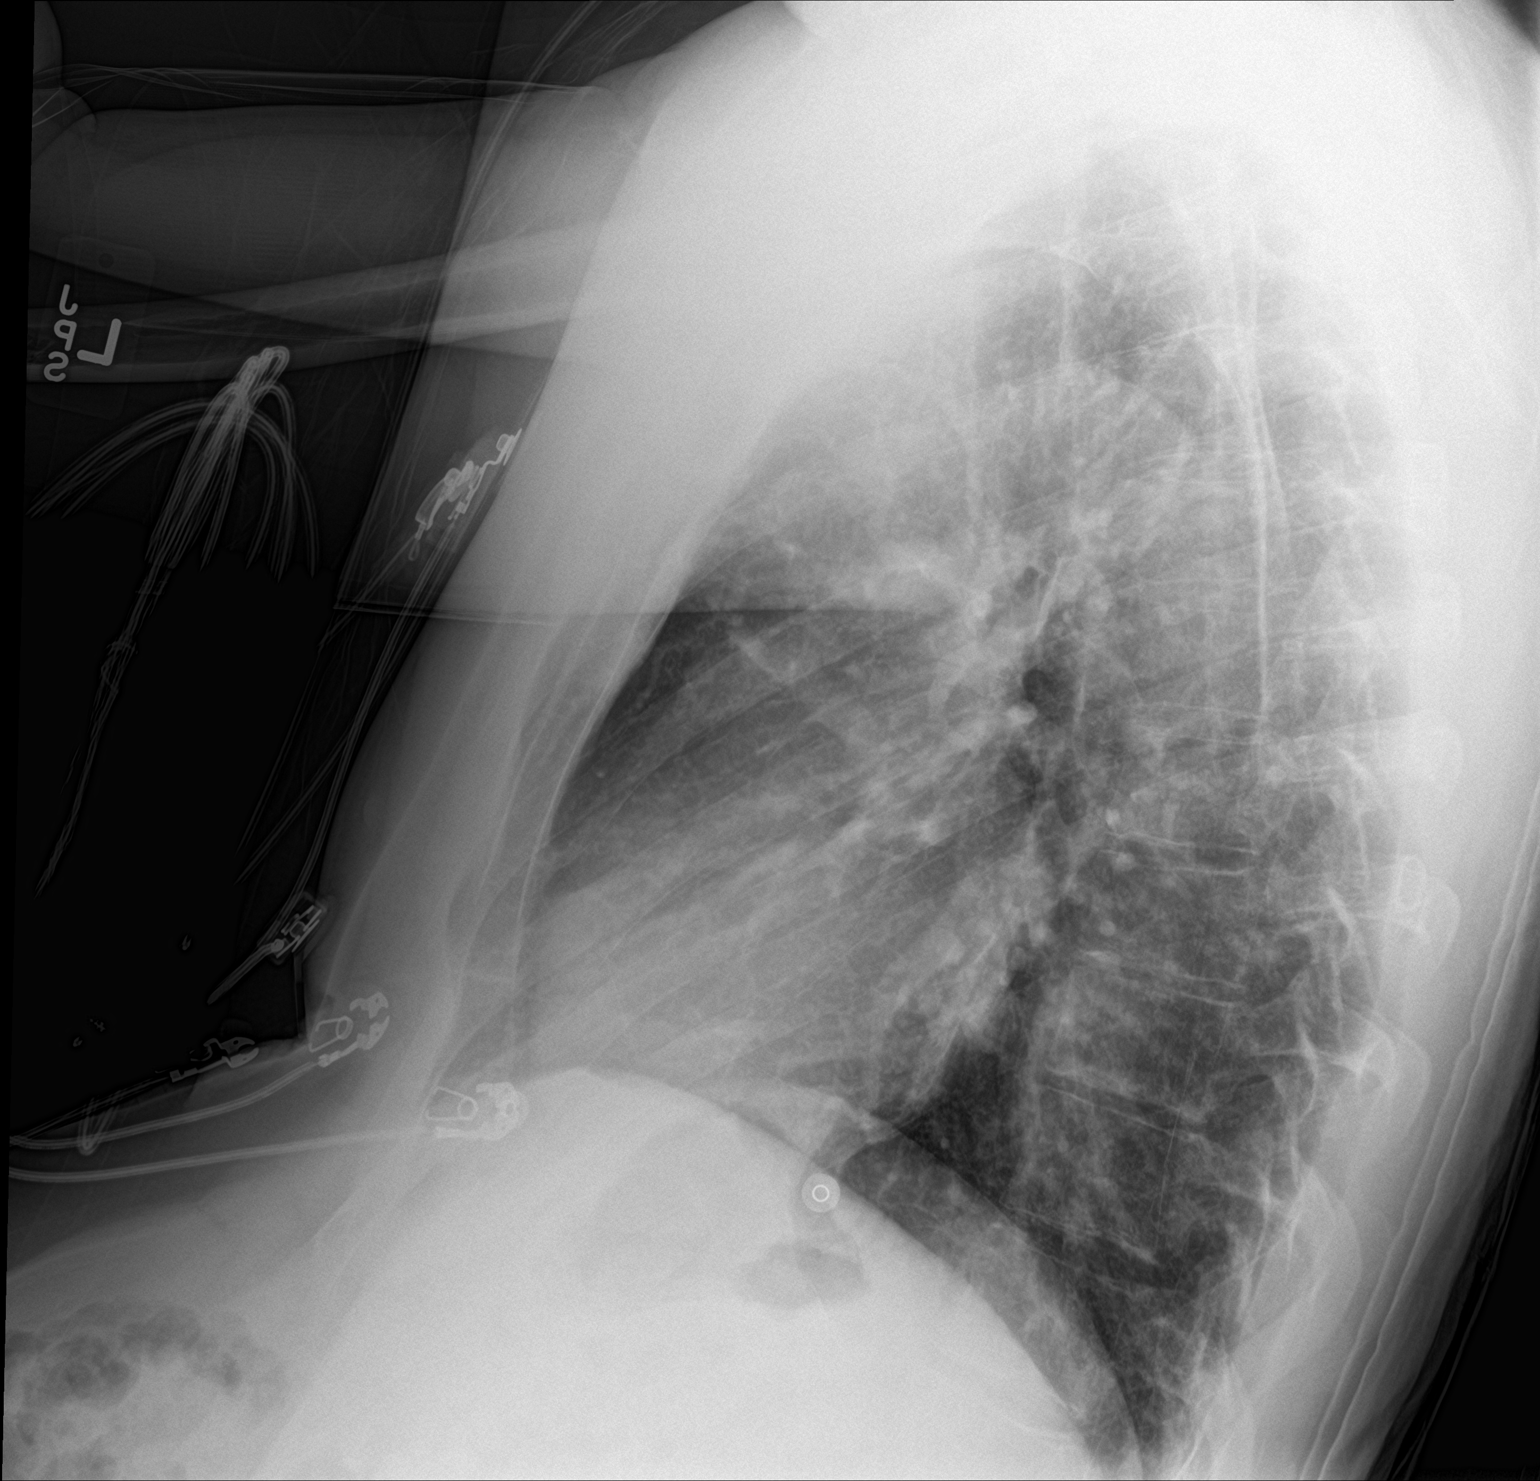

[chest ap]
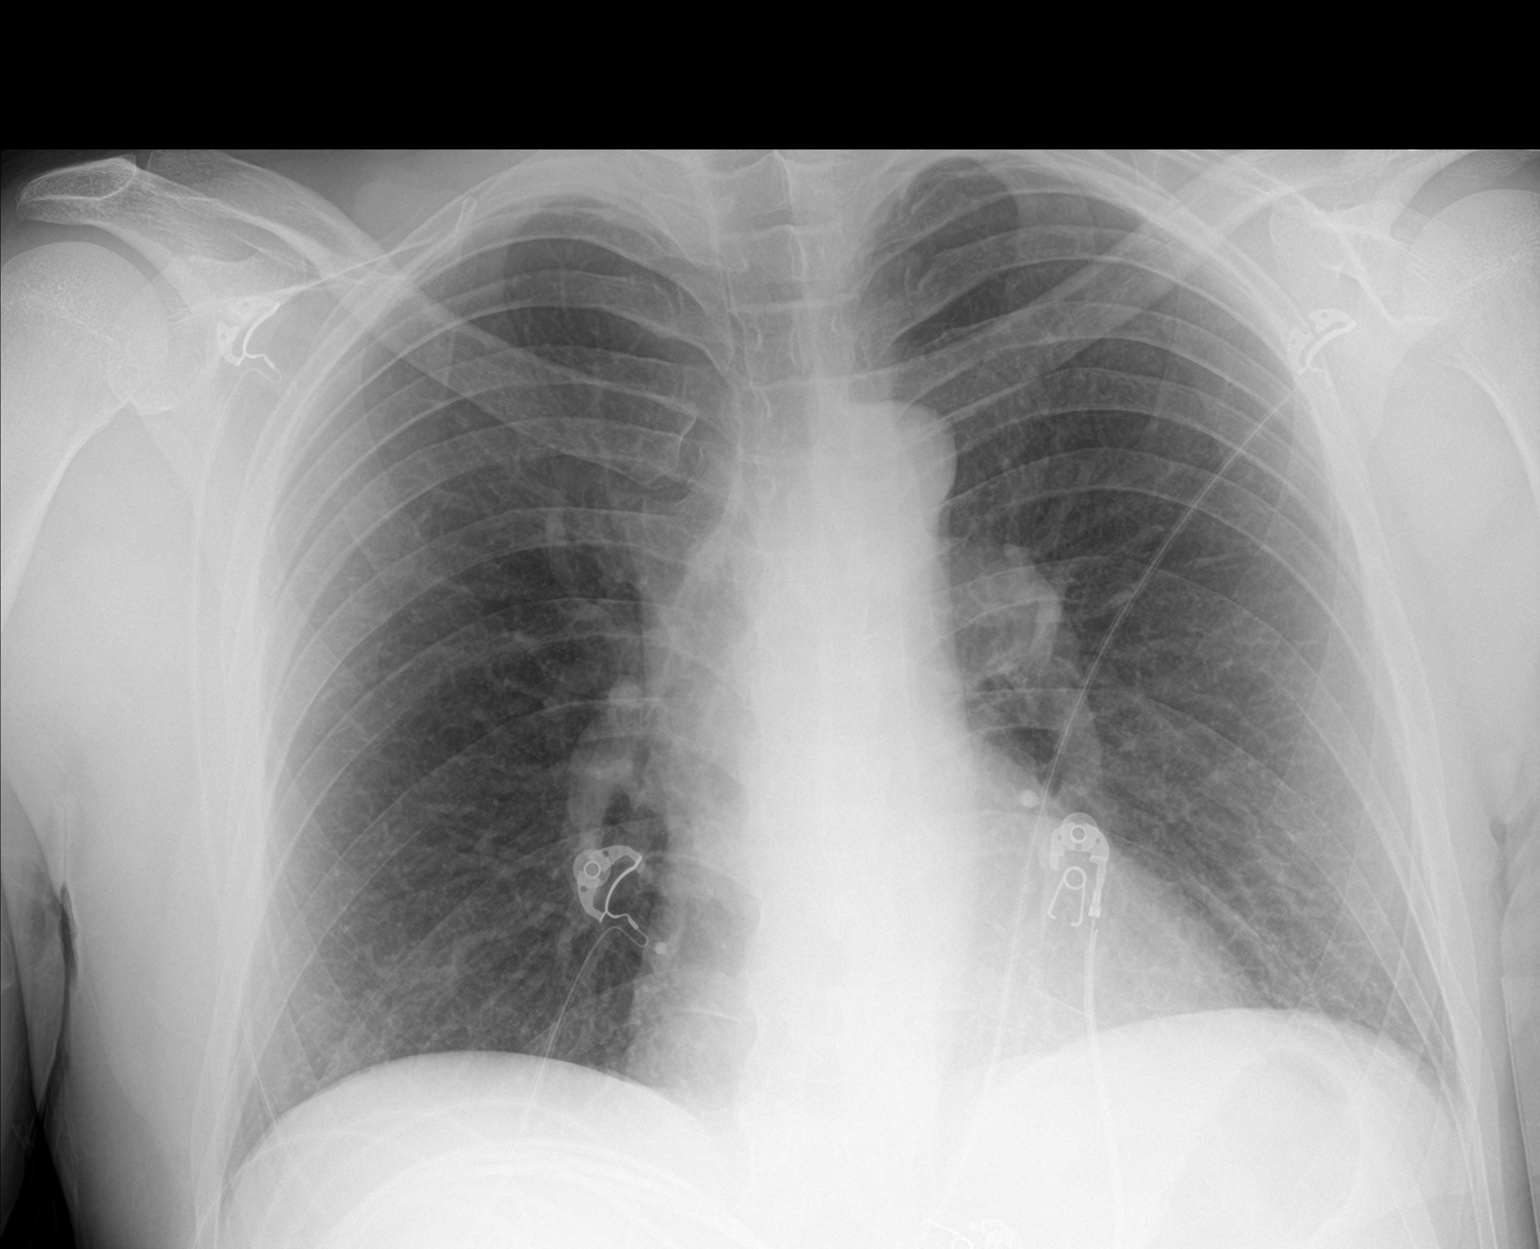

[2 of 2 positions shown; findings below may reference images not displayed]

FINDINGS: The heart size and mediastinal contours are within normal limits.
Both lungs are clear. The visualized skeletal structures are
unremarkable.
IMPRESSION: No active cardiopulmonary disease.

## 2019-11-27 DIAGNOSIS — L309 Dermatitis, unspecified: Secondary | ICD-10-CM | POA: Diagnosis not present

## 2020-01-01 DIAGNOSIS — G4700 Insomnia, unspecified: Secondary | ICD-10-CM | POA: Diagnosis not present

## 2020-01-01 DIAGNOSIS — F410 Panic disorder [episodic paroxysmal anxiety] without agoraphobia: Secondary | ICD-10-CM | POA: Diagnosis not present

## 2020-01-01 DIAGNOSIS — J40 Bronchitis, not specified as acute or chronic: Secondary | ICD-10-CM | POA: Diagnosis not present

## 2020-01-01 DIAGNOSIS — F900 Attention-deficit hyperactivity disorder, predominantly inattentive type: Secondary | ICD-10-CM | POA: Diagnosis not present

## 2020-01-10 DIAGNOSIS — D229 Melanocytic nevi, unspecified: Secondary | ICD-10-CM | POA: Diagnosis not present

## 2020-01-10 DIAGNOSIS — N481 Balanitis: Secondary | ICD-10-CM | POA: Diagnosis not present

## 2020-01-10 DIAGNOSIS — Z1283 Encounter for screening for malignant neoplasm of skin: Secondary | ICD-10-CM | POA: Diagnosis not present

## 2020-01-10 DIAGNOSIS — D1801 Hemangioma of skin and subcutaneous tissue: Secondary | ICD-10-CM | POA: Diagnosis not present

## 2020-01-26 ENCOUNTER — Ambulatory Visit: Payer: No Typology Code available for payment source | Attending: Internal Medicine

## 2020-01-31 ENCOUNTER — Ambulatory Visit: Payer: No Typology Code available for payment source

## 2020-01-31 ENCOUNTER — Ambulatory Visit: Payer: No Typology Code available for payment source | Attending: Internal Medicine

## 2020-01-31 DIAGNOSIS — Z23 Encounter for immunization: Secondary | ICD-10-CM

## 2020-01-31 NOTE — Progress Notes (Signed)
   Covid-19 Vaccination Clinic  Name:  Frank Huerta    MRN: [id number] DOB: September 24, 1981  01/31/2020  [name] was observed post Covid-19 immunization for 15 minutes without incident. He was provided with Vaccine Information Sheet and instruction to access the V-Safe system.   [name] was instructed to call 911 with any severe reactions post vaccine: Marland Kitchen Difficulty breathing  . Swelling of face and throat  . A fast heartbeat  . A bad rash all over body  . Dizziness and weakness   Immunizations Administered    Name Date Dose VIS Date Route   Pfizer COVID-19 Vaccine 01/31/2020 11:27 AM 0.3 mL 10/06/2019 Intramuscular   Manufacturer: [employer], [employer]   Lot: [id number]   NDC: [address]-9

## 2020-02-13 DIAGNOSIS — Z03818 Encounter for observation for suspected exposure to other biological agents ruled out: Secondary | ICD-10-CM | POA: Diagnosis not present

## 2020-02-26 ENCOUNTER — Ambulatory Visit: Payer: No Typology Code available for payment source | Attending: Internal Medicine

## 2020-02-26 DIAGNOSIS — Z23 Encounter for immunization: Secondary | ICD-10-CM

## 2020-02-26 NOTE — Progress Notes (Signed)
   Covid-19 Vaccination Clinic  Name:  Frank Huerta    MRN: [id number] DOB: [birth year]  02/26/2020  [name] was observed post Covid-19 immunization for 15 minutes without incident. He was provided with Vaccine Information Sheet and instruction to access the V-Safe system.   [name] was instructed to call 911 with any severe reactions post vaccine: Marland Kitchen Difficulty breathing  . Swelling of face and throat  . A fast heartbeat  . A bad rash all over body  . Dizziness and weakness   Immunizations Administered    Name Date Dose VIS Date Route   Pfizer COVID-19 Vaccine 02/26/2020 11:30 AM 0.3 mL 12/20/2018 Intramuscular   Manufacturer: [employer], [employer]   Lot: [id number]   NDC: [address]-1

## 2020-02-28 DIAGNOSIS — F524 Premature ejaculation: Secondary | ICD-10-CM | POA: Diagnosis not present

## 2020-02-28 DIAGNOSIS — Q179 Congenital malformation of ear, unspecified: Secondary | ICD-10-CM | POA: Diagnosis not present

## 2020-03-18 DIAGNOSIS — F411 Generalized anxiety disorder: Secondary | ICD-10-CM | POA: Diagnosis not present

## 2020-03-20 DIAGNOSIS — F411 Generalized anxiety disorder: Secondary | ICD-10-CM | POA: Diagnosis not present

## 2020-03-26 DIAGNOSIS — F411 Generalized anxiety disorder: Secondary | ICD-10-CM | POA: Diagnosis not present

## 2020-04-03 DIAGNOSIS — F411 Generalized anxiety disorder: Secondary | ICD-10-CM | POA: Diagnosis not present

## 2020-04-08 DIAGNOSIS — F900 Attention-deficit hyperactivity disorder, predominantly inattentive type: Secondary | ICD-10-CM | POA: Diagnosis not present

## 2020-04-08 DIAGNOSIS — G4700 Insomnia, unspecified: Secondary | ICD-10-CM | POA: Diagnosis not present

## 2020-04-08 DIAGNOSIS — F410 Panic disorder [episodic paroxysmal anxiety] without agoraphobia: Secondary | ICD-10-CM | POA: Diagnosis not present

## 2020-04-08 DIAGNOSIS — Z0000 Encounter for general adult medical examination without abnormal findings: Secondary | ICD-10-CM | POA: Diagnosis not present

## 2020-04-08 DIAGNOSIS — Z13220 Encounter for screening for lipoid disorders: Secondary | ICD-10-CM | POA: Diagnosis not present

## 2020-04-08 DIAGNOSIS — F524 Premature ejaculation: Secondary | ICD-10-CM | POA: Diagnosis not present

## 2020-04-17 DIAGNOSIS — F411 Generalized anxiety disorder: Secondary | ICD-10-CM | POA: Diagnosis not present

## 2020-04-30 DIAGNOSIS — F411 Generalized anxiety disorder: Secondary | ICD-10-CM | POA: Diagnosis not present

## 2020-05-09 DIAGNOSIS — F411 Generalized anxiety disorder: Secondary | ICD-10-CM | POA: Diagnosis not present

## 2020-05-15 DIAGNOSIS — F4322 Adjustment disorder with anxiety: Secondary | ICD-10-CM | POA: Diagnosis not present

## 2020-06-03 DIAGNOSIS — F4322 Adjustment disorder with anxiety: Secondary | ICD-10-CM | POA: Diagnosis not present

## 2020-08-08 DIAGNOSIS — F900 Attention-deficit hyperactivity disorder, predominantly inattentive type: Secondary | ICD-10-CM | POA: Diagnosis not present

## 2020-08-08 DIAGNOSIS — F524 Premature ejaculation: Secondary | ICD-10-CM | POA: Diagnosis not present

## 2020-08-08 DIAGNOSIS — F410 Panic disorder [episodic paroxysmal anxiety] without agoraphobia: Secondary | ICD-10-CM | POA: Diagnosis not present

## 2020-08-08 DIAGNOSIS — G4700 Insomnia, unspecified: Secondary | ICD-10-CM | POA: Diagnosis not present

## 2020-08-16 DIAGNOSIS — Z8481 Family history of carrier of genetic disease: Secondary | ICD-10-CM | POA: Diagnosis not present

## 2020-08-29 DIAGNOSIS — F4322 Adjustment disorder with anxiety: Secondary | ICD-10-CM | POA: Diagnosis not present

## 2020-10-14 DIAGNOSIS — R52 Pain, unspecified: Secondary | ICD-10-CM | POA: Diagnosis not present

## 2020-11-18 DIAGNOSIS — F410 Panic disorder [episodic paroxysmal anxiety] without agoraphobia: Secondary | ICD-10-CM | POA: Diagnosis not present

## 2020-11-18 DIAGNOSIS — F524 Premature ejaculation: Secondary | ICD-10-CM | POA: Diagnosis not present

## 2020-11-18 DIAGNOSIS — F900 Attention-deficit hyperactivity disorder, predominantly inattentive type: Secondary | ICD-10-CM | POA: Diagnosis not present

## 2020-11-18 DIAGNOSIS — Z8481 Family history of carrier of genetic disease: Secondary | ICD-10-CM | POA: Diagnosis not present

## 2021-01-08 DIAGNOSIS — F4322 Adjustment disorder with anxiety: Secondary | ICD-10-CM | POA: Diagnosis not present

## 2021-01-13 DIAGNOSIS — F4322 Adjustment disorder with anxiety: Secondary | ICD-10-CM | POA: Diagnosis not present

## 2021-02-18 DIAGNOSIS — L503 Dermatographic urticaria: Secondary | ICD-10-CM | POA: Diagnosis not present

## 2021-02-18 DIAGNOSIS — Z808 Family history of malignant neoplasm of other organs or systems: Secondary | ICD-10-CM | POA: Diagnosis not present

## 2021-02-18 DIAGNOSIS — L509 Urticaria, unspecified: Secondary | ICD-10-CM | POA: Diagnosis not present

## 2021-02-24 DIAGNOSIS — B029 Zoster without complications: Secondary | ICD-10-CM | POA: Diagnosis not present

## 2021-03-10 DIAGNOSIS — F900 Attention-deficit hyperactivity disorder, predominantly inattentive type: Secondary | ICD-10-CM | POA: Diagnosis not present

## 2021-03-10 DIAGNOSIS — F410 Panic disorder [episodic paroxysmal anxiety] without agoraphobia: Secondary | ICD-10-CM | POA: Diagnosis not present

## 2021-03-10 DIAGNOSIS — Z8481 Family history of carrier of genetic disease: Secondary | ICD-10-CM | POA: Diagnosis not present

## 2021-03-10 DIAGNOSIS — F524 Premature ejaculation: Secondary | ICD-10-CM | POA: Diagnosis not present

## 2021-03-28 DIAGNOSIS — F4322 Adjustment disorder with anxiety: Secondary | ICD-10-CM | POA: Diagnosis not present

## 2021-04-14 DIAGNOSIS — F4322 Adjustment disorder with anxiety: Secondary | ICD-10-CM | POA: Diagnosis not present

## 2021-04-23 DIAGNOSIS — F4322 Adjustment disorder with anxiety: Secondary | ICD-10-CM | POA: Diagnosis not present

## 2021-05-05 DIAGNOSIS — F4322 Adjustment disorder with anxiety: Secondary | ICD-10-CM | POA: Diagnosis not present

## 2021-06-19 DIAGNOSIS — G4700 Insomnia, unspecified: Secondary | ICD-10-CM | POA: Diagnosis not present

## 2021-06-19 DIAGNOSIS — B009 Herpesviral infection, unspecified: Secondary | ICD-10-CM | POA: Diagnosis not present

## 2021-06-19 DIAGNOSIS — F410 Panic disorder [episodic paroxysmal anxiety] without agoraphobia: Secondary | ICD-10-CM | POA: Diagnosis not present

## 2021-06-19 DIAGNOSIS — F900 Attention-deficit hyperactivity disorder, predominantly inattentive type: Secondary | ICD-10-CM | POA: Diagnosis not present

## 2021-07-23 DIAGNOSIS — F4322 Adjustment disorder with anxiety: Secondary | ICD-10-CM | POA: Diagnosis not present

## 2021-07-29 DIAGNOSIS — F4323 Adjustment disorder with mixed anxiety and depressed mood: Secondary | ICD-10-CM | POA: Diagnosis not present

## 2021-08-06 DIAGNOSIS — F4323 Adjustment disorder with mixed anxiety and depressed mood: Secondary | ICD-10-CM | POA: Diagnosis not present

## 2021-08-27 DIAGNOSIS — F4323 Adjustment disorder with mixed anxiety and depressed mood: Secondary | ICD-10-CM | POA: Diagnosis not present

## 2021-09-16 DIAGNOSIS — F4323 Adjustment disorder with mixed anxiety and depressed mood: Secondary | ICD-10-CM | POA: Diagnosis not present

## 2021-09-26 DIAGNOSIS — F4323 Adjustment disorder with mixed anxiety and depressed mood: Secondary | ICD-10-CM | POA: Diagnosis not present

## 2021-10-13 DIAGNOSIS — K648 Other hemorrhoids: Secondary | ICD-10-CM | POA: Diagnosis not present

## 2021-10-13 DIAGNOSIS — F410 Panic disorder [episodic paroxysmal anxiety] without agoraphobia: Secondary | ICD-10-CM | POA: Diagnosis not present

## 2021-10-13 DIAGNOSIS — B009 Herpesviral infection, unspecified: Secondary | ICD-10-CM | POA: Diagnosis not present

## 2021-10-13 DIAGNOSIS — F900 Attention-deficit hyperactivity disorder, predominantly inattentive type: Secondary | ICD-10-CM | POA: Diagnosis not present

## 2021-10-15 DIAGNOSIS — F4323 Adjustment disorder with mixed anxiety and depressed mood: Secondary | ICD-10-CM | POA: Diagnosis not present

## 2021-11-06 DIAGNOSIS — F4323 Adjustment disorder with mixed anxiety and depressed mood: Secondary | ICD-10-CM | POA: Diagnosis not present

## 2021-11-13 DIAGNOSIS — F4322 Adjustment disorder with anxiety: Secondary | ICD-10-CM | POA: Diagnosis not present

## 2021-11-18 DIAGNOSIS — F4323 Adjustment disorder with mixed anxiety and depressed mood: Secondary | ICD-10-CM | POA: Diagnosis not present

## 2021-11-24 DIAGNOSIS — F4323 Adjustment disorder with mixed anxiety and depressed mood: Secondary | ICD-10-CM | POA: Diagnosis not present

## 2021-12-01 DIAGNOSIS — F4323 Adjustment disorder with mixed anxiety and depressed mood: Secondary | ICD-10-CM | POA: Diagnosis not present

## 2021-12-03 ENCOUNTER — Ambulatory Visit: Payer: Self-pay | Admitting: Surgery

## 2021-12-03 DIAGNOSIS — K642 Third degree hemorrhoids: Secondary | ICD-10-CM | POA: Diagnosis not present

## 2021-12-03 DIAGNOSIS — K648 Other hemorrhoids: Secondary | ICD-10-CM | POA: Diagnosis not present

## 2021-12-15 DIAGNOSIS — F4323 Adjustment disorder with mixed anxiety and depressed mood: Secondary | ICD-10-CM | POA: Diagnosis not present

## 2021-12-19 ENCOUNTER — Encounter (HOSPITAL_BASED_OUTPATIENT_CLINIC_OR_DEPARTMENT_OTHER): Payer: Self-pay | Admitting: Surgery

## 2021-12-19 ENCOUNTER — Other Ambulatory Visit: Payer: Self-pay | Admitting: Surgery

## 2021-12-19 DIAGNOSIS — K642 Third degree hemorrhoids: Secondary | ICD-10-CM | POA: Diagnosis not present

## 2021-12-19 NOTE — Progress Notes (Signed)
Called and spoke w/ pt to do pre-op interview for surgery scheduled 12-24-2021 by Dr Michaell Cowing.  Pt informed me that he is having his surgery today 12-19-2021 at [facility]. Called and spoke w/ Joni Reining, triage nurse Dr Malachy Moan, informed her about this and looked up pt and saw where pt surgery was moved to another facility, stated she will let surgery scheduled know.

## 2021-12-24 ENCOUNTER — Ambulatory Visit (HOSPITAL_BASED_OUTPATIENT_CLINIC_OR_DEPARTMENT_OTHER)
Admission: RE | Admit: 2021-12-24 | Payer: No Typology Code available for payment source | Source: Home / Self Care | Admitting: Surgery

## 2021-12-24 HISTORY — DX: Unspecified hemorrhoids: K64.9

## 2021-12-24 HISTORY — DX: Generalized anxiety disorder: F41.1

## 2021-12-24 SURGERY — EXAM UNDER ANESTHESIA WITH HEMORRHOIDECTOMY
Anesthesia: General

## 2021-12-29 DIAGNOSIS — F4323 Adjustment disorder with mixed anxiety and depressed mood: Secondary | ICD-10-CM | POA: Diagnosis not present

## 2022-01-12 DIAGNOSIS — K648 Other hemorrhoids: Secondary | ICD-10-CM | POA: Diagnosis not present

## 2022-01-12 DIAGNOSIS — F410 Panic disorder [episodic paroxysmal anxiety] without agoraphobia: Secondary | ICD-10-CM | POA: Diagnosis not present

## 2022-01-12 DIAGNOSIS — F900 Attention-deficit hyperactivity disorder, predominantly inattentive type: Secondary | ICD-10-CM | POA: Diagnosis not present

## 2022-01-12 DIAGNOSIS — F4323 Adjustment disorder with mixed anxiety and depressed mood: Secondary | ICD-10-CM | POA: Diagnosis not present

## 2022-01-12 DIAGNOSIS — B009 Herpesviral infection, unspecified: Secondary | ICD-10-CM | POA: Diagnosis not present

## 2022-01-22 DIAGNOSIS — F4323 Adjustment disorder with mixed anxiety and depressed mood: Secondary | ICD-10-CM | POA: Diagnosis not present

## 2022-02-11 DIAGNOSIS — F4323 Adjustment disorder with mixed anxiety and depressed mood: Secondary | ICD-10-CM | POA: Diagnosis not present

## 2022-02-27 DIAGNOSIS — F4323 Adjustment disorder with mixed anxiety and depressed mood: Secondary | ICD-10-CM | POA: Diagnosis not present

## 2022-03-02 DIAGNOSIS — F4323 Adjustment disorder with mixed anxiety and depressed mood: Secondary | ICD-10-CM | POA: Diagnosis not present

## 2022-03-10 DIAGNOSIS — J302 Other seasonal allergic rhinitis: Secondary | ICD-10-CM | POA: Diagnosis not present

## 2022-03-12 DIAGNOSIS — Z03818 Encounter for observation for suspected exposure to other biological agents ruled out: Secondary | ICD-10-CM | POA: Diagnosis not present

## 2022-03-12 DIAGNOSIS — J069 Acute upper respiratory infection, unspecified: Secondary | ICD-10-CM | POA: Diagnosis not present

## 2022-03-12 DIAGNOSIS — J029 Acute pharyngitis, unspecified: Secondary | ICD-10-CM | POA: Diagnosis not present

## 2022-03-30 DIAGNOSIS — F4323 Adjustment disorder with mixed anxiety and depressed mood: Secondary | ICD-10-CM | POA: Diagnosis not present

## 2022-04-08 DIAGNOSIS — F4322 Adjustment disorder with anxiety: Secondary | ICD-10-CM | POA: Diagnosis not present

## 2022-04-14 DIAGNOSIS — F900 Attention-deficit hyperactivity disorder, predominantly inattentive type: Secondary | ICD-10-CM | POA: Diagnosis not present

## 2022-04-14 DIAGNOSIS — F17201 Nicotine dependence, unspecified, in remission: Secondary | ICD-10-CM | POA: Diagnosis not present

## 2022-04-14 DIAGNOSIS — Z13220 Encounter for screening for lipoid disorders: Secondary | ICD-10-CM | POA: Diagnosis not present

## 2022-04-14 DIAGNOSIS — R5383 Other fatigue: Secondary | ICD-10-CM | POA: Diagnosis not present

## 2022-04-14 DIAGNOSIS — F410 Panic disorder [episodic paroxysmal anxiety] without agoraphobia: Secondary | ICD-10-CM | POA: Diagnosis not present

## 2022-04-15 DIAGNOSIS — F4323 Adjustment disorder with mixed anxiety and depressed mood: Secondary | ICD-10-CM | POA: Diagnosis not present

## 2022-04-21 DIAGNOSIS — F4323 Adjustment disorder with mixed anxiety and depressed mood: Secondary | ICD-10-CM | POA: Diagnosis not present

## 2022-05-18 DIAGNOSIS — F4323 Adjustment disorder with mixed anxiety and depressed mood: Secondary | ICD-10-CM | POA: Diagnosis not present

## 2022-06-26 DIAGNOSIS — F4323 Adjustment disorder with mixed anxiety and depressed mood: Secondary | ICD-10-CM | POA: Diagnosis not present

## 2022-07-13 DIAGNOSIS — F17201 Nicotine dependence, unspecified, in remission: Secondary | ICD-10-CM | POA: Diagnosis not present

## 2022-07-13 DIAGNOSIS — F900 Attention-deficit hyperactivity disorder, predominantly inattentive type: Secondary | ICD-10-CM | POA: Diagnosis not present

## 2022-07-13 DIAGNOSIS — F410 Panic disorder [episodic paroxysmal anxiety] without agoraphobia: Secondary | ICD-10-CM | POA: Diagnosis not present

## 2022-07-13 DIAGNOSIS — B009 Herpesviral infection, unspecified: Secondary | ICD-10-CM | POA: Diagnosis not present

## 2022-09-27 DIAGNOSIS — S43102A Unspecified dislocation of left acromioclavicular joint, initial encounter: Secondary | ICD-10-CM | POA: Insufficient documentation

## 2022-09-27 DIAGNOSIS — W010XXA Fall on same level from slipping, tripping and stumbling without subsequent striking against object, initial encounter: Secondary | ICD-10-CM | POA: Insufficient documentation

## 2022-09-27 DIAGNOSIS — Y9259 Other trade areas as the place of occurrence of the external cause: Secondary | ICD-10-CM | POA: Diagnosis not present

## 2022-09-28 ENCOUNTER — Encounter (HOSPITAL_COMMUNITY): Payer: Self-pay

## 2022-09-28 ENCOUNTER — Emergency Department (HOSPITAL_COMMUNITY): Payer: BC Managed Care – PPO

## 2022-09-28 ENCOUNTER — Emergency Department (HOSPITAL_COMMUNITY)
Admission: EM | Admit: 2022-09-28 | Discharge: 2022-09-28 | Disposition: A | Discharge status: Home / Self Care | Payer: BC Managed Care – PPO | Attending: Emergency Medicine | Admitting: Emergency Medicine

## 2022-09-28 DIAGNOSIS — S43102A Unspecified dislocation of left acromioclavicular joint, initial encounter: Secondary | ICD-10-CM | POA: Diagnosis not present

## 2022-09-28 MED ORDER — FENTANYL CITRATE PF 50 MCG/ML IJ SOSY
50.0000 ug | PREFILLED_SYRINGE | Freq: Once | INTRAMUSCULAR | Status: AC
  Administered 2022-09-28: 50 ug via INTRAMUSCULAR
  Filled 2022-09-28: qty 1

## 2022-09-28 MED ORDER — OXYCODONE-ACETAMINOPHEN 5-325 MG PO TABS: 1.0000 | ORAL_TABLET | ORAL | 0 refills | Status: DC | PRN

## 2022-09-28 NOTE — Discharge Instructions (Addendum)
Take the prescribed medication as directed.  DO NOT MIX PAIN MEDICATION WITH ALCOHOL. Follow-up with Dr. Carola Frost-- I would call his office in the morning to arrange follow-up appt. Return to the ED for new or worsening symptoms.

## 2022-09-28 NOTE — ED Triage Notes (Signed)
Pt states that he went to give his friend a hug and fell over due to ETOH, L shoulder dislocation

## 2022-09-28 NOTE — ED Provider Triage Note (Signed)
Emergency Medicine Provider Triage Evaluation Note  Frank Huerta , a 41 y.o. male  was evaluated in triage.  Pt complains of L shoulder pain. Was at a bar and went to give his friend a "bear hug" when he missed and fell on the ground. Landed on L shoulder. Abrasion to L forehead, but denies LOC. C/o constant L shoulder pain. Denies hx of dislocation.  Review of Systems  Positive: As above Negative: As above  Physical Exam  BP 129/70 (BP Location: Right Arm)   Pulse (!) 117   Temp 98.8 F (37.1 C)   Resp 17   SpO2 93%  Gen:   Awake, no distress   Resp:  Normal effort  MSK:   Limited ROM of the L shoulder without palpable deformity; suspect dislocation. No crepitus. Other:  Distal radial pulse 2+ in the LUE. Abrasion to L forehead. No hemotympanum.   Medical Decision Making  Medically screening exam initiated at 12:23 AM.  Appropriate orders placed.  Frank Huerta was informed that the remainder of the evaluation will be completed by another provider, this initial triage assessment does not replace that evaluation, and the importance of remaining in the ED until their evaluation is complete.  L shoulder pain s/p mechanical fall. Last ate food around lunch time. Has been drinking ETOH tonight; intoxicated.    Antony Madura, PA-C 09/28/22 0031

## 2022-09-28 NOTE — ED Provider Notes (Signed)
[facility] EMERGENCY DEPARTMENT Provider Note   CSN: [id number] Arrival date & time: 09/27/22  2357     History  Chief Complaint  Patient presents with   Frank Huerta is a 41 y.o. male.  The history is provided by the patient and medical records.  Fall   41 y.o. M presenting to the ED with left shoulder injury.  States he was at a holiday party at a bar went to hug his friend and missed causing him to fall on his left shoulder.  He scraped left side of his head against the floor but there was no significant impact, no loss of consciousness.  He has isolated pain to the left shoulder, feels like it may be dislocated.  He is right-hand dominant.  Has had significant EtOH tonight.  Home Medications Prior to Admission medications   Medication Sig Start Date End Date Taking? Authorizing Provider  oxyCODONE-acetaminophen (PERCOCET) 5-325 MG tablet Take 1 tablet by mouth every 4 (four) hours as needed. 09/28/22  Yes Garlon Hatchet, PA-C  amphetamine-dextroamphetamine (ADDERALL) 10 MG tablet Take 5 mg by mouth daily as needed.    [provider]  valACYclovir (VALTREX) 1000 MG tablet Take 1 tablet (1,000 mg total) by mouth 2 (two) times daily. 06/22/18   Ofilia Neas, PA-C      Allergies    Patient has no known allergies.    Review of Systems   Review of Systems  Musculoskeletal:  Positive for arthralgias.  All other systems reviewed and are negative.   Physical Exam Updated Vital Signs BP (!) 119/59   Pulse 90   Temp 98.8 F (37.1 C)   Resp (!) 22   SpO2 93%  Physical Exam Vitals and nursing note reviewed.  Constitutional:      Appearance: He is well-developed.     Comments: intoxicated  HENT:     Head: Normocephalic and atraumatic.     Comments: Minor abrasion to left temple, there is not hematoma or skull depression Eyes:     Conjunctiva/sclera: Conjunctivae normal.     Pupils: Pupils are equal, round, and reactive to light.   Cardiovascular:     Rate and Rhythm: Normal rate and regular rhythm.     Heart sounds: Normal heart sounds.  Pulmonary:     Effort: Pulmonary effort is normal.     Breath sounds: Normal breath sounds.  Abdominal:     General: Bowel sounds are normal.     Palpations: Abdomen is soft.  Musculoskeletal:        General: Normal range of motion.     Cervical back: Normal range of motion.     Comments: Left shoulder is tender along the AC joint, high riding clavicle present, radial pulse intact, normal grips  Skin:    General: Skin is warm and dry.  Neurological:     Mental Status: He is alert and oriented to person, place, and time.     Comments: Intoxicated but AAOx3, able to answer questions and follow commands, limited movement of LUE due to injury but otherwise moving extremities well, no focal deficits     ED Results / Procedures / Treatments   Labs (all labs ordered are listed, but only abnormal results are displayed) Labs Reviewed - No data to display  EKG None  Radiology DG Shoulder Left  Result Date: 09/28/2022 CLINICAL DATA:  Left shoulder pain. EXAM: LEFT SHOULDER - 2+ VIEW COMPARISON:  None Available.  FINDINGS: There is widening of the left [facility] joint with superior elevation of the clavicle in relation to the acromion. The inferior margin of the clavicle is approximately 15 mm superior to the superior margin of the acromion. There is increased coracoclavicular distance measuring approximately 3 cm. No acute fracture. The bones are well mineralized. The soft tissues are unremarkable. IMPRESSION: Left AC joint separation. No acute fracture. Electronically Signed   By: Anner Crete M.D.   On: 09/28/2022 00:57    Procedures Procedures    Medications Ordered in ED Medications  fentaNYL (SUBLIMAZE) injection 50 mcg (50 mcg Intramuscular Given 09/28/22 0027)    ED Course/ Medical Decision Making/ A&P                           Medical Decision Making Risk Prescription  drug management.   41 year old male here with left shoulder injury.  He had been drinking went to hug a friend, missed, and fell onto the left shoulder.  Scraped his head on the floor but there was no significant impact or loss of consciousness.  Complains of isolated left shoulder pain.  Does have slight deformity at the left [facility] joint with high riding clavicle.  His arm is neurovascular intact with normal grip strength.  He is intoxicated but able to answer questions and follow commands appropriately.  He has a minor abrasion to the left temple.  X-ray revealing left AC separation.  This was discussed with patient and his fiance at the bedside.  He was placed in shoulder sling and will be referred to orthopedics for follow-up.  Given short supply of pain medication, advised not to drink alcohol while taking this.  He may return here for any new or acute changes.  Final Clinical Impression(s) / ED Diagnoses Final diagnoses:  Separation of left acromioclavicular joint, initial encounter    Rx / DC Orders ED Discharge Orders          Ordered    oxyCODONE-acetaminophen (PERCOCET) 5-325 MG tablet  Every 4 hours PRN        09/28/22 0224              Larene Pickett, PA-C 09/28/22 0556    Maudie Flakes, MD 09/28/22 [phone]

## 2022-09-28 NOTE — Progress Notes (Signed)
Orthopedic Tech Progress Note Patient Details:  Frank Huerta [birth year] [id number]  Ortho Devices Type of Ortho Device: Sling immobilizer Ortho Device/Splint Location: lue Ortho Device/Splint Interventions: Ordered, Application, Adjustment  I assisted patient with applying a shirt before applying the sling. Post Interventions Patient Tolerated: Well Instructions Provided: Care of device, Adjustment of device  Trinna Post 09/28/2022, 1:52 AM

## 2022-09-30 DIAGNOSIS — S43102A Unspecified dislocation of left acromioclavicular joint, initial encounter: Secondary | ICD-10-CM | POA: Diagnosis not present

## 2022-10-13 DIAGNOSIS — F4323 Adjustment disorder with mixed anxiety and depressed mood: Secondary | ICD-10-CM | POA: Diagnosis not present

## 2022-10-14 DIAGNOSIS — Q829 Congenital malformation of skin, unspecified: Secondary | ICD-10-CM | POA: Diagnosis not present

## 2022-10-14 DIAGNOSIS — F900 Attention-deficit hyperactivity disorder, predominantly inattentive type: Secondary | ICD-10-CM | POA: Diagnosis not present

## 2022-10-14 DIAGNOSIS — F419 Anxiety disorder, unspecified: Secondary | ICD-10-CM | POA: Diagnosis not present

## 2022-10-20 DIAGNOSIS — S43132A Dislocation of left acromioclavicular joint, greater than 200% displacement, initial encounter: Secondary | ICD-10-CM | POA: Diagnosis not present

## 2022-10-20 DIAGNOSIS — Y999 Unspecified external cause status: Secondary | ICD-10-CM | POA: Diagnosis not present

## 2022-10-20 DIAGNOSIS — X58XXXA Exposure to other specified factors, initial encounter: Secondary | ICD-10-CM | POA: Diagnosis not present

## 2022-10-20 DIAGNOSIS — S43112A Subluxation of left acromioclavicular joint, initial encounter: Secondary | ICD-10-CM | POA: Diagnosis not present

## 2022-10-20 DIAGNOSIS — S43005A Unspecified dislocation of left shoulder joint, initial encounter: Secondary | ICD-10-CM | POA: Diagnosis not present

## 2022-10-20 DIAGNOSIS — G8918 Other acute postprocedural pain: Secondary | ICD-10-CM | POA: Diagnosis not present

## 2022-11-02 DIAGNOSIS — S43005D Unspecified dislocation of left shoulder joint, subsequent encounter: Secondary | ICD-10-CM | POA: Diagnosis not present

## 2022-11-05 DIAGNOSIS — F4323 Adjustment disorder with mixed anxiety and depressed mood: Secondary | ICD-10-CM | POA: Diagnosis not present

## 2022-11-11 DIAGNOSIS — Z03818 Encounter for observation for suspected exposure to other biological agents ruled out: Secondary | ICD-10-CM | POA: Diagnosis not present

## 2022-11-11 DIAGNOSIS — R0989 Other specified symptoms and signs involving the circulatory and respiratory systems: Secondary | ICD-10-CM | POA: Diagnosis not present

## 2022-11-11 DIAGNOSIS — R059 Cough, unspecified: Secondary | ICD-10-CM | POA: Diagnosis not present

## 2022-11-17 DIAGNOSIS — F4323 Adjustment disorder with mixed anxiety and depressed mood: Secondary | ICD-10-CM | POA: Diagnosis not present

## 2022-11-17 DIAGNOSIS — F1010 Alcohol abuse, uncomplicated: Secondary | ICD-10-CM | POA: Diagnosis not present

## 2022-11-18 DIAGNOSIS — F4322 Adjustment disorder with anxiety: Secondary | ICD-10-CM | POA: Diagnosis not present

## 2022-11-26 DIAGNOSIS — F4322 Adjustment disorder with anxiety: Secondary | ICD-10-CM | POA: Diagnosis not present

## 2022-11-30 DIAGNOSIS — S43005D Unspecified dislocation of left shoulder joint, subsequent encounter: Secondary | ICD-10-CM | POA: Diagnosis not present

## 2022-12-10 DIAGNOSIS — F4322 Adjustment disorder with anxiety: Secondary | ICD-10-CM | POA: Diagnosis not present

## 2022-12-11 DIAGNOSIS — M6281 Muscle weakness (generalized): Secondary | ICD-10-CM | POA: Diagnosis not present

## 2022-12-11 DIAGNOSIS — S43142D Inferior dislocation of left acromioclavicular joint, subsequent encounter: Secondary | ICD-10-CM | POA: Diagnosis not present

## 2022-12-11 DIAGNOSIS — M25612 Stiffness of left shoulder, not elsewhere classified: Secondary | ICD-10-CM | POA: Diagnosis not present

## 2022-12-21 DIAGNOSIS — M25612 Stiffness of left shoulder, not elsewhere classified: Secondary | ICD-10-CM | POA: Diagnosis not present

## 2022-12-21 DIAGNOSIS — M6281 Muscle weakness (generalized): Secondary | ICD-10-CM | POA: Diagnosis not present

## 2022-12-21 DIAGNOSIS — S43142D Inferior dislocation of left acromioclavicular joint, subsequent encounter: Secondary | ICD-10-CM | POA: Diagnosis not present

## 2022-12-23 DIAGNOSIS — M25612 Stiffness of left shoulder, not elsewhere classified: Secondary | ICD-10-CM | POA: Diagnosis not present

## 2022-12-23 DIAGNOSIS — S43142D Inferior dislocation of left acromioclavicular joint, subsequent encounter: Secondary | ICD-10-CM | POA: Diagnosis not present

## 2022-12-23 DIAGNOSIS — F4322 Adjustment disorder with anxiety: Secondary | ICD-10-CM | POA: Diagnosis not present

## 2022-12-23 DIAGNOSIS — M6281 Muscle weakness (generalized): Secondary | ICD-10-CM | POA: Diagnosis not present

## 2022-12-28 DIAGNOSIS — S43142D Inferior dislocation of left acromioclavicular joint, subsequent encounter: Secondary | ICD-10-CM | POA: Diagnosis not present

## 2022-12-28 DIAGNOSIS — M25612 Stiffness of left shoulder, not elsewhere classified: Secondary | ICD-10-CM | POA: Diagnosis not present

## 2022-12-28 DIAGNOSIS — M6281 Muscle weakness (generalized): Secondary | ICD-10-CM | POA: Diagnosis not present

## 2022-12-30 DIAGNOSIS — F4322 Adjustment disorder with anxiety: Secondary | ICD-10-CM | POA: Diagnosis not present

## 2022-12-31 DIAGNOSIS — M6281 Muscle weakness (generalized): Secondary | ICD-10-CM | POA: Diagnosis not present

## 2022-12-31 DIAGNOSIS — M25612 Stiffness of left shoulder, not elsewhere classified: Secondary | ICD-10-CM | POA: Diagnosis not present

## 2022-12-31 DIAGNOSIS — S43142D Inferior dislocation of left acromioclavicular joint, subsequent encounter: Secondary | ICD-10-CM | POA: Diagnosis not present

## 2023-01-04 DIAGNOSIS — M25612 Stiffness of left shoulder, not elsewhere classified: Secondary | ICD-10-CM | POA: Diagnosis not present

## 2023-01-04 DIAGNOSIS — S43142D Inferior dislocation of left acromioclavicular joint, subsequent encounter: Secondary | ICD-10-CM | POA: Diagnosis not present

## 2023-01-04 DIAGNOSIS — M6281 Muscle weakness (generalized): Secondary | ICD-10-CM | POA: Diagnosis not present

## 2023-01-06 DIAGNOSIS — M6281 Muscle weakness (generalized): Secondary | ICD-10-CM | POA: Diagnosis not present

## 2023-01-06 DIAGNOSIS — S43142D Inferior dislocation of left acromioclavicular joint, subsequent encounter: Secondary | ICD-10-CM | POA: Diagnosis not present

## 2023-01-06 DIAGNOSIS — M25612 Stiffness of left shoulder, not elsewhere classified: Secondary | ICD-10-CM | POA: Diagnosis not present

## 2023-01-07 DIAGNOSIS — D492 Neoplasm of unspecified behavior of bone, soft tissue, and skin: Secondary | ICD-10-CM | POA: Diagnosis not present

## 2023-01-07 DIAGNOSIS — D225 Melanocytic nevi of trunk: Secondary | ICD-10-CM | POA: Diagnosis not present

## 2023-01-07 DIAGNOSIS — F4322 Adjustment disorder with anxiety: Secondary | ICD-10-CM | POA: Diagnosis not present

## 2023-01-07 DIAGNOSIS — L814 Other melanin hyperpigmentation: Secondary | ICD-10-CM | POA: Diagnosis not present

## 2023-01-07 DIAGNOSIS — D2262 Melanocytic nevi of left upper limb, including shoulder: Secondary | ICD-10-CM | POA: Diagnosis not present

## 2023-01-07 DIAGNOSIS — D2272 Melanocytic nevi of left lower limb, including hip: Secondary | ICD-10-CM | POA: Diagnosis not present

## 2023-01-11 DIAGNOSIS — S43142D Inferior dislocation of left acromioclavicular joint, subsequent encounter: Secondary | ICD-10-CM | POA: Diagnosis not present

## 2023-01-11 DIAGNOSIS — M25612 Stiffness of left shoulder, not elsewhere classified: Secondary | ICD-10-CM | POA: Diagnosis not present

## 2023-01-11 DIAGNOSIS — M6281 Muscle weakness (generalized): Secondary | ICD-10-CM | POA: Diagnosis not present

## 2023-01-13 DIAGNOSIS — W57XXXA Bitten or stung by nonvenomous insect and other nonvenomous arthropods, initial encounter: Secondary | ICD-10-CM | POA: Diagnosis not present

## 2023-01-13 DIAGNOSIS — F419 Anxiety disorder, unspecified: Secondary | ICD-10-CM | POA: Diagnosis not present

## 2023-01-13 DIAGNOSIS — S43142D Inferior dislocation of left acromioclavicular joint, subsequent encounter: Secondary | ICD-10-CM | POA: Diagnosis not present

## 2023-01-13 DIAGNOSIS — S30861A Insect bite (nonvenomous) of abdominal wall, initial encounter: Secondary | ICD-10-CM | POA: Diagnosis not present

## 2023-01-13 DIAGNOSIS — M25612 Stiffness of left shoulder, not elsewhere classified: Secondary | ICD-10-CM | POA: Diagnosis not present

## 2023-01-13 DIAGNOSIS — F900 Attention-deficit hyperactivity disorder, predominantly inattentive type: Secondary | ICD-10-CM | POA: Diagnosis not present

## 2023-01-13 DIAGNOSIS — M6281 Muscle weakness (generalized): Secondary | ICD-10-CM | POA: Diagnosis not present

## 2023-01-14 DIAGNOSIS — F4322 Adjustment disorder with anxiety: Secondary | ICD-10-CM | POA: Diagnosis not present

## 2023-01-14 DIAGNOSIS — Z125 Encounter for screening for malignant neoplasm of prostate: Secondary | ICD-10-CM | POA: Diagnosis not present

## 2023-01-14 DIAGNOSIS — R739 Hyperglycemia, unspecified: Secondary | ICD-10-CM | POA: Diagnosis not present

## 2023-01-18 DIAGNOSIS — M6281 Muscle weakness (generalized): Secondary | ICD-10-CM | POA: Diagnosis not present

## 2023-01-18 DIAGNOSIS — S43142D Inferior dislocation of left acromioclavicular joint, subsequent encounter: Secondary | ICD-10-CM | POA: Diagnosis not present

## 2023-01-18 DIAGNOSIS — M25612 Stiffness of left shoulder, not elsewhere classified: Secondary | ICD-10-CM | POA: Diagnosis not present

## 2023-01-20 DIAGNOSIS — S43142D Inferior dislocation of left acromioclavicular joint, subsequent encounter: Secondary | ICD-10-CM | POA: Diagnosis not present

## 2023-01-20 DIAGNOSIS — M6281 Muscle weakness (generalized): Secondary | ICD-10-CM | POA: Diagnosis not present

## 2023-01-20 DIAGNOSIS — M25612 Stiffness of left shoulder, not elsewhere classified: Secondary | ICD-10-CM | POA: Diagnosis not present

## 2023-01-25 DIAGNOSIS — M6281 Muscle weakness (generalized): Secondary | ICD-10-CM | POA: Diagnosis not present

## 2023-01-25 DIAGNOSIS — M25612 Stiffness of left shoulder, not elsewhere classified: Secondary | ICD-10-CM | POA: Diagnosis not present

## 2023-01-25 DIAGNOSIS — S43142D Inferior dislocation of left acromioclavicular joint, subsequent encounter: Secondary | ICD-10-CM | POA: Diagnosis not present

## 2023-02-05 DIAGNOSIS — R17 Unspecified jaundice: Secondary | ICD-10-CM | POA: Diagnosis not present

## 2023-02-05 DIAGNOSIS — R140 Abdominal distension (gaseous): Secondary | ICD-10-CM | POA: Diagnosis not present

## 2023-02-05 DIAGNOSIS — K760 Fatty (change of) liver, not elsewhere classified: Secondary | ICD-10-CM | POA: Diagnosis not present

## 2023-02-08 DIAGNOSIS — S43142D Inferior dislocation of left acromioclavicular joint, subsequent encounter: Secondary | ICD-10-CM | POA: Diagnosis not present

## 2023-02-08 DIAGNOSIS — M6281 Muscle weakness (generalized): Secondary | ICD-10-CM | POA: Diagnosis not present

## 2023-02-08 DIAGNOSIS — M25612 Stiffness of left shoulder, not elsewhere classified: Secondary | ICD-10-CM | POA: Diagnosis not present

## 2023-02-15 DIAGNOSIS — M6281 Muscle weakness (generalized): Secondary | ICD-10-CM | POA: Diagnosis not present

## 2023-02-15 DIAGNOSIS — M25612 Stiffness of left shoulder, not elsewhere classified: Secondary | ICD-10-CM | POA: Diagnosis not present

## 2023-02-15 DIAGNOSIS — S43142D Inferior dislocation of left acromioclavicular joint, subsequent encounter: Secondary | ICD-10-CM | POA: Diagnosis not present

## 2023-03-01 DIAGNOSIS — M6281 Muscle weakness (generalized): Secondary | ICD-10-CM | POA: Diagnosis not present

## 2023-03-01 DIAGNOSIS — S43142D Inferior dislocation of left acromioclavicular joint, subsequent encounter: Secondary | ICD-10-CM | POA: Diagnosis not present

## 2023-03-01 DIAGNOSIS — M25612 Stiffness of left shoulder, not elsewhere classified: Secondary | ICD-10-CM | POA: Diagnosis not present

## 2023-03-15 DIAGNOSIS — F411 Generalized anxiety disorder: Secondary | ICD-10-CM | POA: Diagnosis not present

## 2023-03-15 DIAGNOSIS — S43142D Inferior dislocation of left acromioclavicular joint, subsequent encounter: Secondary | ICD-10-CM | POA: Diagnosis not present

## 2023-03-15 DIAGNOSIS — R400 Somnolence: Secondary | ICD-10-CM | POA: Diagnosis not present

## 2023-03-19 DIAGNOSIS — R195 Other fecal abnormalities: Secondary | ICD-10-CM | POA: Diagnosis not present

## 2023-03-19 DIAGNOSIS — R945 Abnormal results of liver function studies: Secondary | ICD-10-CM | POA: Diagnosis not present

## 2023-03-19 DIAGNOSIS — R7989 Other specified abnormal findings of blood chemistry: Secondary | ICD-10-CM | POA: Diagnosis not present

## 2023-03-19 DIAGNOSIS — R140 Abdominal distension (gaseous): Secondary | ICD-10-CM | POA: Diagnosis not present

## 2023-04-07 DIAGNOSIS — F4322 Adjustment disorder with anxiety: Secondary | ICD-10-CM | POA: Diagnosis not present

## 2023-04-12 DIAGNOSIS — F4322 Adjustment disorder with anxiety: Secondary | ICD-10-CM | POA: Diagnosis not present

## 2023-04-15 DIAGNOSIS — F4322 Adjustment disorder with anxiety: Secondary | ICD-10-CM | POA: Diagnosis not present

## 2023-04-19 DIAGNOSIS — R030 Elevated blood-pressure reading, without diagnosis of hypertension: Secondary | ICD-10-CM | POA: Diagnosis not present

## 2023-04-19 DIAGNOSIS — R7989 Other specified abnormal findings of blood chemistry: Secondary | ICD-10-CM | POA: Diagnosis not present

## 2023-04-19 DIAGNOSIS — G4719 Other hypersomnia: Secondary | ICD-10-CM | POA: Diagnosis not present

## 2023-04-28 DIAGNOSIS — F4322 Adjustment disorder with anxiety: Secondary | ICD-10-CM | POA: Diagnosis not present

## 2023-05-03 DIAGNOSIS — Z713 Dietary counseling and surveillance: Secondary | ICD-10-CM | POA: Diagnosis not present

## 2023-05-06 DIAGNOSIS — F419 Anxiety disorder, unspecified: Secondary | ICD-10-CM | POA: Diagnosis not present

## 2023-05-06 DIAGNOSIS — F900 Attention-deficit hyperactivity disorder, predominantly inattentive type: Secondary | ICD-10-CM | POA: Diagnosis not present

## 2023-05-06 DIAGNOSIS — Z0000 Encounter for general adult medical examination without abnormal findings: Secondary | ICD-10-CM | POA: Diagnosis not present

## 2023-05-11 DIAGNOSIS — F4322 Adjustment disorder with anxiety: Secondary | ICD-10-CM | POA: Diagnosis not present

## 2023-05-18 DIAGNOSIS — G4733 Obstructive sleep apnea (adult) (pediatric): Secondary | ICD-10-CM | POA: Diagnosis not present

## 2023-05-18 DIAGNOSIS — F419 Anxiety disorder, unspecified: Secondary | ICD-10-CM | POA: Diagnosis not present

## 2023-05-20 DIAGNOSIS — F4322 Adjustment disorder with anxiety: Secondary | ICD-10-CM | POA: Diagnosis not present

## 2023-05-24 DIAGNOSIS — F4322 Adjustment disorder with anxiety: Secondary | ICD-10-CM | POA: Diagnosis not present

## 2023-05-26 ENCOUNTER — Other Ambulatory Visit: Payer: Self-pay | Admitting: Orthopedic Surgery

## 2023-05-26 DIAGNOSIS — S4380XA Sprain of other specified parts of unspecified shoulder girdle, initial encounter: Secondary | ICD-10-CM

## 2023-05-26 DIAGNOSIS — S43142D Inferior dislocation of left acromioclavicular joint, subsequent encounter: Secondary | ICD-10-CM | POA: Diagnosis not present

## 2023-05-27 DIAGNOSIS — G4733 Obstructive sleep apnea (adult) (pediatric): Secondary | ICD-10-CM | POA: Diagnosis not present

## 2023-06-01 ENCOUNTER — Ambulatory Visit
Admission: RE | Admit: 2023-06-01 | Discharge: 2023-06-01 | Disposition: A | Discharge status: Home / Self Care | Payer: No Typology Code available for payment source | Source: Ambulatory Visit | Attending: Orthopedic Surgery | Admitting: Orthopedic Surgery

## 2023-06-01 DIAGNOSIS — S4380XA Sprain of other specified parts of unspecified shoulder girdle, initial encounter: Secondary | ICD-10-CM

## 2023-06-09 DIAGNOSIS — F4322 Adjustment disorder with anxiety: Secondary | ICD-10-CM | POA: Diagnosis not present

## 2023-06-09 DIAGNOSIS — S43142D Inferior dislocation of left acromioclavicular joint, subsequent encounter: Secondary | ICD-10-CM | POA: Diagnosis not present

## 2023-06-10 DIAGNOSIS — F4322 Adjustment disorder with anxiety: Secondary | ICD-10-CM | POA: Diagnosis not present

## 2023-06-10 NOTE — Progress Notes (Unsigned)
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   HISTORY OF PRESENTING ILLNESS:  Frank Huerta 42 y.o. male with medical history significant for ADD, anxiety and hemorrhoids presents to the hematology clinic for newly diagnosed hereditary hemochromatosis. He is accompanied by his wife for this visit.   On review of the previous records, Frank Huerta was evaluated by gastroenterologist, Dr. Levora Angel, for elevated LFTs. He underwent hemochromatosis DNA testing on 04/18/2022 that revealed homozygous mutation of C282Y HFE gene.   On exam today, Frank Huerta reports he is anxious about getting phlebotomies but otherwise feeling well. He has modified his diet with limiting red meat and eating more veggies/fruits. He recently stopped smoking. He reports no fatigue and can complete all his ADLs on his own. He denies nausea, vomiting or bowel habit changes. He takes metamucil to have regular bowel movements due to history of hemorrhoids. He denies easy bruising or signs of bleeding. He denies fevers, chills, sweats, shortness of breath, chest pain or cough. He has no other complaints. Rest of the ROS is below.   MEDICAL HISTORY:  Past Medical History:  Diagnosis Date   Attention deficit disorder (ADD)    Cocaine abuse (HCC)    GAD (generalized anxiety disorder)    Hemorrhoids    History of condyloma acuminatum 11/2003   urethral    SURGICAL HISTORY: Past Surgical History:  Procedure Laterality Date   URETHROPLASTY  12/12/2003   @[facility]  by dr s. Patsi Sears;   meatotomy,  excision wart distal urethral, cystoscopy, and urethroplasty    SOCIAL HISTORY: Social History   Socioeconomic History    Marital status: Single    Spouse name: Not on file   Number of children: Not on file   Years of education: Not on file   Highest education level: Not on file  Occupational History   Not on file  Tobacco Use   Smoking status: Every Day    Types: E-cigarettes   Smokeless tobacco: Never  Vaping Use   Vaping status: Every Day  Substance and Sexual Activity   Alcohol use: Yes    Comment: three times weekly   Drug use: Yes    Types: Cocaine   Sexual activity: Not on file  Other Topics Concern   Not on file  Social History Narrative   Not on file   Social Determinants of Health   Financial Resource Strain: Not on file  Food Insecurity: Not on file  Transportation Needs: Not on file  Physical Activity: Not on file  Stress: Not on file  Social Connections: Not on file  Intimate Partner Violence: Not on file    FAMILY HISTORY: No family history on file.  ALLERGIES:  has No Known Allergies.  MEDICATIONS:  Current Outpatient Medications  Medication Sig Dispense Refill   amphetamine-dextroamphetamine (ADDERALL) 10 MG tablet Take 5 mg by mouth daily as needed.     valACYclovir (VALTREX) 1000 MG tablet Take 1 tablet (1,000 mg total) by mouth 2 (two) times daily. 10 tablet 2   oxyCODONE-acetaminophen (PERCOCET) 5-325 MG tablet Take 1 tablet by mouth every 4 (four) hours as needed. (Patient not taking: Reported on 06/11/2023) 15 tablet 0   No current facility-administered medications for this visit.    REVIEW OF SYSTEMS:  Constitutional: ( - ) fevers, ( - )  chills , ( - ) night sweats Eyes: ( - ) blurriness of vision, ( - ) double vision, ( - ) watery eyes Ears, nose, mouth, throat, and face: ( - ) mucositis, ( - ) sore throat Respiratory: ( - ) cough, ( - ) dyspnea, ( - ) wheezes Cardiovascular: ( - ) palpitation, ( - ) chest discomfort, ( - ) lower extremity swelling Gastrointestinal:  ( - ) nausea, ( - ) heartburn, ( - ) change in bowel habits Skin: ( - ) abnormal skin  rashes Lymphatics: ( - ) new lymphadenopathy, ( - ) easy bruising Neurological: ( - ) numbness, ( - ) tingling, ( - ) new weaknesses Behavioral/Psych: ( - ) mood change, ( - ) new changes  All other systems were reviewed with the patient and are negative.  PHYSICAL EXAMINATION: ECOG PERFORMANCE STATUS: 0 - Asymptomatic  Vitals:   06/11/23 0908 06/11/23 0925  BP: (!) 144/97 128/89  Pulse: 68   Resp: 17   Temp: 98.1 F (36.7 C)   SpO2: 99%    Filed Weights   06/11/23 0908  Weight: 214 lb 8 oz (97.3 kg)    GENERAL: well appearing male in NAD  SKIN: skin color, texture, turgor are normal, no rashes or significant lesions EYES: conjunctiva are pink and non-injected, sclera clear LYMPH:  no palpable lymphadenopathy in the cervical or supraclavicular lymph nodes.  LUNGS: clear to auscultation and percussion with normal breathing effort HEART: regular rate & rhythm and no murmurs and no lower extremity edema Musculoskeletal: no cyanosis of digits and no clubbing  PSYCH: alert & oriented x 3, fluent speech NEURO: no focal motor/sensory deficits  LABORATORY DATA:  I have reviewed the data as listed    Latest Ref Rng & Units 09/30/2017   12:01 PM 05/01/2017    4:25 PM  CBC  WBC 4.0 - 10.5 K/uL 8.7  11.3   Hemoglobin 13.0 - 17.0 g/dL 56.3  87.5   Hematocrit 39.0 - 52.0 % 42.1  44.7   Platelets 150 - 400 K/uL 173  212        Latest Ref Rng & Units 09/30/2017   12:01 PM 05/01/2017    4:25 PM  CMP  Glucose 65 - 99 mg/dL 643  329   BUN 6 - 20 mg/dL 12  9   Creatinine 5.18 - 1.24 mg/dL 8.41  6.60   Sodium 630 - 145 mmol/L 137  137   Potassium 3.5 - 5.1 mmol/L 3.5  5.1   Chloride 101 - 111 mmol/L 104  102   CO2 22 - 32 mmol/L 20  23   Calcium 8.9 - 10.3 mg/dL 9.4  9.5     RADIOGRAPHIC STUDIES: I have personally reviewed the radiological images as listed and agreed with the findings in the report. CT SHOULDER LEFT WO CONTRAST  Result Date: 06/09/2023 CLINICAL DATA:  AC joint  separation injury 8 months ago with subsequent surgery. Persistent pain/pressure/tightness. Possible clavicle fracture. EXAM: CT OF THE UPPER LEFT EXTREMITY WITHOUT CONTRAST TECHNIQUE: Multidetector CT imaging of the upper left extremity was performed according to the standard protocol. RADIATION DOSE REDUCTION: This exam was performed according to the departmental dose-optimization program which includes automated exposure control, adjustment of the mA and/or kV according to patient size and/or use of iterative reconstruction technique. COMPARISON:  Radiographs 09/28/2022.  None recent. FINDINGS: Bones/Joint/Cartilage Residual widening of the acromioclavicular joint 11 mm, improved from the  preoperative radiographs. There is no significant residual superior displacement of the distal clavicle relative to the acromion. 9 mm ossific density along the anterior aspect of the distal clavicle likely represents an old avulsion fracture. There is deformity of the distal clavicle approximately 3 cm medial to the acromioclavicular joint which is likely postsurgical in etiology. There is an additional small ossific fragment between the distal clavicle and the coracoid process which could relate to failed coracoclavicular ligament reconstruction. No evidence of acute fracture or dislocation. The humeral head and glenohumeral joint appear unremarkable. No significant shoulder joint effusion. Ligaments Suboptimally assessed by CT. As above, possible postsurgical changes from interval coracoclavicular ligament reconstruction. Muscles and Tendons No focal rotator cuff muscular atrophy. There is no impingement on the rotator cuff tendons. The biceps tendon is grossly intact. Soft tissues No periarticular fluid collection, foreign body or soft tissue emphysema. The left axilla and visualized portions of the left lung appear unremarkable. IMPRESSION: 1. Residual widening of the acromioclavicular joint, improved from the preoperative  radiographs. 2. No evidence of acute fracture. Deformity of the distal clavicle approximately 3 cm medial to the acromioclavicular joint, likely postsurgical in etiology. Small ossific density between the distal clavicle and coracoid process could relate to failure of coracoclavicular ligament reconstruction; correlate clinically. 3. No focal rotator cuff muscular atrophy or impingement on the rotator cuff tendons. Electronically Signed   By: Carey Bullocks M.D.   On: 06/09/2023 08:56    ASSESSMENT & PLAN Frank Huerta is a 42 y.o. male who presents for evaluation of hereditary hemochromatosis. Hereditary hemochromatosis is a hereditary condition caused by mutations in the HFE gene, which regulates iron absorption. The most common genes mutated in this condition are the C282Y and H63D genes. Based on outside testing, Frank Huerta has evidence of homozygous mutation of C282Y gene.Homozygous mutations represent a disease state which requires phlebotomy to decrease ferritin levels to a goal of <50  (Blood (2010) 116 (3): 317-325). The goal is to decrease ferritin so there is no deposition in critical organs (liver, heart, pancreas and thyroid).   #Hereditary Hemochromatosis-homozygous mutation of C282Y gene --labs today to check CBC, CMP, iron panel, repeat hemochromatosis DNA testing, TSH levels.  --Will plan to begin phlebotomies weekly next week with goal ferritin <50.  --Outside US liver from 02/05/2023 showed hepatomegaly with fatty liver infiltration. No evidence of iron deposition.  --Will order echo to evaluate for cardiac iron deposition.  --Advised to stop alcohol and limit red meat consumption. Avoid any iron supplementation.  --RTC in 6 weeks with labs and follow up  *ADDENDUM* Critical labs were received today with AST 499 and ALT 605, findings are consistent with acute hepatitis like secondary to heavy alcohol consumption over the weekend. Reiterated to patient that he needs to completely stop  alcohol intake. If liver enzymes do not improve over the next 1-2 weeks after holding alcohol and starting phlebotomies, we will need to obtain repeat liver imaging and liver biopsy. Patient expressed understanding of the recommendations.   Orders Placed This Encounter  Procedures   CBC with Differential (Cancer Center Only)    Standing Status:   Future    Standing Expiration Date:   06/10/2024   CMP (Cancer Center only)    Standing Status:   Future    Standing Expiration Date:   06/10/2024   Iron and Iron Binding Capacity ([facility] only)    Standing Status:   Future    Standing Expiration Date:   06/10/2024   Ferritin  Standing Status:   Future    Standing Expiration Date:   06/10/2024   Hemochromatosis DNA, PCR    Standing Status:   Future    Standing Expiration Date:   06/10/2024   TSH    Standing Status:   Future    Standing Expiration Date:   06/10/2024   ECHOCARDIOGRAM COMPLETE    Standing Status:   Future    Standing Expiration Date:   06/10/2024    Order Specific Question:   Where should this test be performed    Answer:   [facility]    Order Specific Question:   Perflutren DEFINITY (image enhancing agent) should be administered unless hypersensitivity or allergy exist    Answer:   Administer Perflutren    Order Specific Question:   Reason for exam-Echo    Answer:   Other-Full Diagnosis List    Order Specific Question:   Full ICD-10/Reason for Exam    Answer:   Hemochromatosis [161096]    All questions were answered. The patient knows to call the clinic with any problems, questions or concerns.  I have spent a total of 60 minutes minutes of face-to-face and non-face-to-face time, preparing to see the patient, obtaining and/or reviewing separately obtained history, performing a medically appropriate examination, counseling and educating the patient, ordering medications/tests/procedures, referring and communicating with other health care professionals, documenting clinical  information in the electronic health record, independently interpreting results and communicating results to the patient, and care coordination.   Georga Kaufmann, PA-C Department of Hematology/Oncology [facility] at [facility] Phone: [phone]  Patient was seen with Dr. Candise Che.   ADDENDUM  .Patient was Personally and independently interviewed, examined and relevant elements of the history of present illness were reviewed in details and an assessment and plan was created. All elements of the patient's history of present illness , assessment and plan were discussed in details with Georga Kaufmann, PA-C. The above documentation reflects our combined findings assessment and plan.   Wyvonnia Lora MD MS

## 2023-06-11 ENCOUNTER — Inpatient Hospital Stay: Payer: BC Managed Care – PPO

## 2023-06-11 ENCOUNTER — Telehealth: Payer: Self-pay | Admitting: Physician Assistant

## 2023-06-11 ENCOUNTER — Inpatient Hospital Stay: Payer: BC Managed Care – PPO | Admitting: Physician Assistant

## 2023-06-11 ENCOUNTER — Telehealth: Payer: Self-pay

## 2023-06-11 DIAGNOSIS — F411 Generalized anxiety disorder: Secondary | ICD-10-CM | POA: Diagnosis not present

## 2023-06-11 DIAGNOSIS — Z8719 Personal history of other diseases of the digestive system: Secondary | ICD-10-CM | POA: Insufficient documentation

## 2023-06-11 DIAGNOSIS — F17210 Nicotine dependence, cigarettes, uncomplicated: Secondary | ICD-10-CM | POA: Diagnosis not present

## 2023-06-11 DIAGNOSIS — K760 Fatty (change of) liver, not elsewhere classified: Secondary | ICD-10-CM | POA: Diagnosis not present

## 2023-06-11 LAB — CBC WITH DIFFERENTIAL (CANCER CENTER ONLY)
Abs Immature Granulocytes: 0.03 10*3/uL (ref 0.00–0.07)
Basophils Absolute: 0 10*3/uL (ref 0.0–0.1)
Basophils Relative: 1 %
Eosinophils Absolute: 0.2 10*3/uL (ref 0.0–0.5)
Eosinophils Relative: 4 %
HCT: 50.8 % (ref 39.0–52.0)
Hemoglobin: 18.1 g/dL — ABNORMAL HIGH (ref 13.0–17.0)
Immature Granulocytes: 1 %
Lymphocytes Relative: 30 %
Lymphs Abs: 1.6 10*3/uL (ref 0.7–4.0)
MCH: 33.8 pg (ref 26.0–34.0)
MCHC: 35.6 g/dL (ref 30.0–36.0)
MCV: 94.8 fL (ref 80.0–100.0)
Monocytes Absolute: 0.4 10*3/uL (ref 0.1–1.0)
Monocytes Relative: 8 %
Neutro Abs: 3.1 10*3/uL (ref 1.7–7.7)
Neutrophils Relative %: 56 %
Platelet Count: 165 10*3/uL (ref 150–400)
RBC: 5.36 MIL/uL (ref 4.22–5.81)
RDW: 11.9 % (ref 11.5–15.5)
WBC Count: 5.4 10*3/uL (ref 4.0–10.5)
nRBC: 0 % (ref 0.0–0.2)

## 2023-06-11 LAB — FERRITIN: Ferritin: 7012 ng/mL — ABNORMAL HIGH (ref 24–336)

## 2023-06-11 LAB — IRON AND IRON BINDING CAPACITY (CC-WL,HP ONLY)
Iron: 273 ug/dL — ABNORMAL HIGH (ref 45–182)
Saturation Ratios: 100 % — ABNORMAL HIGH (ref 17.9–39.5)
TIBC: 274 ug/dL (ref 250–450)
UIBC: 1 ug/dL — ABNORMAL LOW (ref 117–376)

## 2023-06-11 LAB — CMP (CANCER CENTER ONLY)
ALT: 605 U/L (ref 0–44)
AST: 499 U/L (ref 15–41)
Albumin: 4.5 g/dL (ref 3.5–5.0)
Alkaline Phosphatase: 65 U/L (ref 38–126)
Anion gap: 5 (ref 5–15)
BUN: 14 mg/dL (ref 6–20)
CO2: 27 mmol/L (ref 22–32)
Calcium: 9.4 mg/dL (ref 8.9–10.3)
Chloride: 105 mmol/L (ref 98–111)
Creatinine: 0.91 mg/dL (ref 0.61–1.24)
GFR, Estimated: >60 mL/min (ref >60)
Glucose, Bld: 97 mg/dL (ref 70–99)
Potassium: 4 mmol/L (ref 3.5–5.1)
Sodium: 137 mmol/L (ref 135–145)
Total Bilirubin: 1.2 mg/dL (ref 0.3–1.2)
Total Protein: 7.3 g/dL (ref 6.5–8.1)

## 2023-06-11 LAB — TSH: TSH: 2.133 u[IU]/mL (ref 0.350–4.500)

## 2023-06-11 NOTE — Telephone Encounter (Signed)
CRITICAL VALUE STICKER  CRITICAL VALUE: AST -499  RECEIVER: Chell M,CMA  DATE & TIME NOTIFIED: 06/11/2023 11:35 AM  MESSENGER (representative from lab):  MD NOTIFIED: PA-C Georga Kaufmann  TIME OF NOTIFICATION: 11:40 am  RESPONSE:

## 2023-06-14 ENCOUNTER — Inpatient Hospital Stay: Payer: BC Managed Care – PPO

## 2023-06-14 ENCOUNTER — Telehealth: Payer: Self-pay

## 2023-06-14 ENCOUNTER — Other Ambulatory Visit: Payer: Self-pay | Admitting: *Deleted

## 2023-06-14 DIAGNOSIS — K760 Fatty (change of) liver, not elsewhere classified: Secondary | ICD-10-CM | POA: Diagnosis not present

## 2023-06-14 DIAGNOSIS — F17210 Nicotine dependence, cigarettes, uncomplicated: Secondary | ICD-10-CM | POA: Diagnosis not present

## 2023-06-14 DIAGNOSIS — Z8719 Personal history of other diseases of the digestive system: Secondary | ICD-10-CM | POA: Diagnosis not present

## 2023-06-14 DIAGNOSIS — F411 Generalized anxiety disorder: Secondary | ICD-10-CM | POA: Diagnosis not present

## 2023-06-14 LAB — CBC WITH DIFFERENTIAL (CANCER CENTER ONLY)
Abs Immature Granulocytes: 0.03 10*3/uL (ref 0.00–0.07)
Basophils Absolute: 0 10*3/uL (ref 0.0–0.1)
Basophils Relative: 1 %
Eosinophils Absolute: 0.1 10*3/uL (ref 0.0–0.5)
Eosinophils Relative: 2 %
HCT: 51.4 % (ref 39.0–52.0)
Hemoglobin: 18.4 g/dL — ABNORMAL HIGH (ref 13.0–17.0)
Immature Granulocytes: 0 %
Lymphocytes Relative: 28 %
Lymphs Abs: 2.3 10*3/uL (ref 0.7–4.0)
MCH: 34.3 pg — ABNORMAL HIGH (ref 26.0–34.0)
MCHC: 35.8 g/dL (ref 30.0–36.0)
MCV: 95.7 fL (ref 80.0–100.0)
Monocytes Absolute: 0.6 10*3/uL (ref 0.1–1.0)
Monocytes Relative: 7 %
Neutro Abs: 5.1 10*3/uL (ref 1.7–7.7)
Neutrophils Relative %: 62 %
Platelet Count: 175 10*3/uL (ref 150–400)
RBC: 5.37 MIL/uL (ref 4.22–5.81)
RDW: 12.3 % (ref 11.5–15.5)
WBC Count: 8.3 10*3/uL (ref 4.0–10.5)
nRBC: 0 % (ref 0.0–0.2)

## 2023-06-14 LAB — CMP (CANCER CENTER ONLY)
ALT: 723 U/L (ref 0–44)
AST: 317 U/L (ref 15–41)
Albumin: 4.6 g/dL (ref 3.5–5.0)
Alkaline Phosphatase: 77 U/L (ref 38–126)
Anion gap: 9 (ref 5–15)
BUN: 12 mg/dL (ref 6–20)
CO2: 25 mmol/L (ref 22–32)
Calcium: 9.9 mg/dL (ref 8.9–10.3)
Chloride: 106 mmol/L (ref 98–111)
Creatinine: 0.93 mg/dL (ref 0.61–1.24)
GFR, Estimated: >60 mL/min (ref >60)
Glucose, Bld: 106 mg/dL — ABNORMAL HIGH (ref 70–99)
Potassium: 4.3 mmol/L (ref 3.5–5.1)
Sodium: 140 mmol/L (ref 135–145)
Total Bilirubin: 0.9 mg/dL (ref 0.3–1.2)
Total Protein: 7 g/dL (ref 6.5–8.1)

## 2023-06-14 LAB — FERRITIN: Ferritin: 6340 ng/mL — ABNORMAL HIGH (ref 24–336)

## 2023-06-14 NOTE — Progress Notes (Signed)
Frank Huerta presents today for phlebotomy per MD orders. Phlebotomy procedure started at 1428 and ended at 1435. A 16 G phlebotomy kit to the [facility] was used.  516 grams removed. Patient observed for 30 minutes after procedure without any incident. Patient tolerated procedure moderately well. Patient extremely anxious d/t fear of needles and blood. Prior to the start of the procedure Pt was laid flat in the infusion chair, cold cloths were placed on Pt's neck and forehead (covering his eyes), TV was covered, Pt was turned away from R arm, Pt had ear buds in his ears listening to music, and fiance was chairside providing support. After obtaining access PIV site was covered with gauze to prevent possible viewing by Pt. This RN and Equities trader remained chairside throughout procedure and provided additional support.  IV needle removed intact.  Pt provided snacks and beverage.  Pt remained anxious, but alert throughout entire procedure.  Pt VSS at discharge & Pt ambulatory to lobby without complaint.

## 2023-06-14 NOTE — Patient Instructions (Signed)
Therapeutic Phlebotomy, Care After The following information offers guidance on how to care for yourself after your procedure. Your health care provider may also give you more specific instructions. If you have problems or questions, contact your health care provider. What can I expect after the procedure? After therapeutic phlebotomy, it is common to have: Light-headedness or dizziness. You may feel faint. Nausea. Tiredness (fatigue). Follow these instructions at home: Eating and drinking Be sure to eat well-balanced meals for the next 24 hours. Drink enough fluid to keep your urine pale yellow. Avoid drinking alcohol on the day that you had the procedure. Activity  Return to your normal activities as told by your health care provider. Most people can go back to their normal activities right away. Avoid activities that take a lot of effort for about 5 hours after the procedure. Athletes should avoid strenuous exercise for at least 12 hours. Avoid heavy lifting or pulling for about 5 hours after the procedure. Do not lift anything that is heavier than 10 lb (4.5 kg). Change positions slowly for the remainder of the day, like from sitting to standing. This can help prevent light-headedness or fainting. If you feel light-headed, lie down until the feeling goes away. Needle insertion site care  Keep your bandage (dressing) dry. You can remove the bandage after about 5 hours or as told by your health care provider. If you have bleeding from the needle insertion site, raise (elevate) your arm and press firmly on the site until the bleeding stops. If you have bruising at the site, apply ice to the area. To do this: Put ice in a plastic bag. Place a towel between your skin and the bag. Leave the ice on for 20 minutes, 2-3 times a day for the first 24 hours. Remove the ice if your skin turns bright red so you do not damage the area. If the swelling does not go away after 24 hours, apply a warm,  moist cloth (warm compress) to the area for 20 minutes, 2-3 times a day. General instructions Do not use any products that contain nicotine or tobacco, like cigarettes, chewing tobacco, and vaping devices, such as e-cigarettes, for at least 30 minutes after the procedure. If you need help quitting, ask your health care provider. Keep all follow-up visits. You may need to continue having regular blood tests and therapeutic phlebotomy treatments as directed. Contact a health care provider if: You have redness, swelling, or pain at the needle insertion site. Fluid or blood is coming from the needle insertion site. Pus or a bad smell is coming from the needle insertion site. The needle insertion site feels warm to the touch. You feel light-headed, dizzy, or nauseous, and the feeling does not go away. You have new bruising at the needle insertion site. You feel weaker than normal. You have a fever or chills. Get help right away if: You have chest pain. You have trouble breathing. You have severe nausea or vomiting. Summary After the procedure, it is common to have some light-headedness, dizziness, nausea, or tiredness (fatigue). Be sure to eat well-balanced meals for the next 24 hours. Drink enough fluid to keep your urine pale yellow. Return to your normal activities as told by your health care provider. Keep all follow-up visits. You may need to continue having regular blood tests and therapeutic phlebotomy treatments as directed. This information is not intended to replace advice given to you by your health care provider. Make sure you discuss any questions you have  with your health care provider. Document Revised: 04/09/2021 Document Reviewed: 04/09/2021 Elsevier Patient Education  2024 [employer]   Iron-Rich Diet  Iron is a mineral that helps your body produce hemoglobin. Hemoglobin is a protein in red blood cells that carries oxygen to your body's tissues. Eating too little iron may  cause you to feel weak and tired, and it can increase your risk of infection. Iron is naturally found in many foods, and many foods have iron added to them (are iron-fortified). You may need to follow an iron-rich diet if you do not have enough iron in your body due to certain medical conditions. The amount of iron that you need each day depends on your age, your sex, and any medical conditions you have. Follow instructions from your health care provider or a dietitian about how much iron you should eat each day. What are tips for following this plan? Reading food labels Check food labels to see how many milligrams (mg) of iron are in each serving. Cooking [place] foods in pots and pans that are made from iron. Take these steps to make it easier for your body to absorb iron from certain foods: Soak beans overnight before cooking. Soak whole grains overnight and drain them before using. Ferment flours before baking, such as by using yeast in bread dough. Meal planning When you eat foods that contain iron, you should eat them with foods that are high in vitamin C. These include oranges, peppers, tomatoes, potatoes, and mangoes. Vitamin C helps your body absorb iron. Certain foods and drinks prevent your body from absorbing iron properly. Avoid eating these foods in the same meal as iron-rich foods or with iron supplements. These foods include: Coffee, black tea, and red wine. Milk, dairy products, and foods that are high in calcium. Beans and soybeans. Whole grains. General information Take iron supplements only as told by your health care provider. An overdose of iron can be life-threatening. If you were prescribed iron supplements, take them with orange juice or a vitamin C supplement. When you eat iron-fortified foods or take an iron supplement, you should also eat foods that naturally contain iron, such as meat, poultry, and fish. Eating naturally iron-rich foods helps your body absorb the iron  that is added to other foods or contained in a supplement. Iron from animal sources is better absorbed than iron from plant sources. What foods should I eat? Fruits Prunes. Raisins. Eat fruits high in vitamin C, such as oranges, grapefruits, and strawberries, with iron-rich foods. Vegetables Spinach (cooked). Green peas. Broccoli. Fermented vegetables. Eat vegetables high in vitamin C, such as leafy greens, potatoes, bell peppers, and tomatoes, with iron-rich foods. Grains Iron-fortified breakfast cereal. Iron-fortified whole-wheat bread. Enriched rice. Sprouted grains. Meats and other proteins Beef liver. Beef. [place]. Chicken. Oysters. Shrimp. Tuna. Sardines. Chickpeas. Nuts. Tofu. Pumpkin seeds. Beverages Tomato juice. Fresh orange juice. Prune juice. Hibiscus tea. Iron-fortified instant breakfast shakes. Sweets and desserts Blackstrap molasses. Seasonings and condiments Tahini. Fermented soy sauce. Other foods Wheat germ. The items listed above may not be a complete list of recommended foods and beverages. Contact a dietitian for more information. What foods should I limit? These are foods that should be limited while eating iron-rich foods as they can reduce the absorption of iron in your body. Grains Whole grains. Bran cereal. Bran flour. Meats and other proteins Soybeans. Products made from soy protein. Black beans. Lentils. Mung beans. Split peas. Dairy Milk. Cream. Cheese. Yogurt. Cottage cheese. Beverages Coffee. Black tea.  Red wine. Sweets and desserts Cocoa. Chocolate. Ice cream. Seasonings and condiments Basil. Oregano. Large amounts of parsley. The items listed above may not be a complete list of foods and beverages you should limit. Contact a dietitian for more information. Summary Iron is a mineral that helps your body produce hemoglobin. Hemoglobin is a protein in red blood cells that carries oxygen to your body's tissues. Iron is naturally found in many foods,  and many foods have iron added to them (are iron-fortified). When you eat foods that contain iron, you should eat them with foods that are high in vitamin C. Vitamin C helps your body absorb iron. Certain foods and drinks prevent your body from absorbing iron properly, such as whole grains and dairy products. You should avoid eating these foods in the same meal as iron-rich foods or with iron supplements. This information is not intended to replace advice given to you by your health care provider. Make sure you discuss any questions you have with your health care provider. Document Revised: 09/23/2020 Document Reviewed: 09/23/2020 Elsevier Patient Education  2024 [employer].

## 2023-06-14 NOTE — Telephone Encounter (Signed)
CRITICAL VALUE STICKER  CRITICAL VALUE:  ALT 723   AST 317  RECEIVER (on-site recipient of call):Louana Fontenot, LPN  DATE & TIME NOTIFIED: 06/14/23  4:08  MESSENGER (representative from lab):  Efraim Kaufmann  MD NOTIFIED: Georga Kaufmann, PA-C   TIME OF NOTIFICATION: 4:09  RESPONSE:

## 2023-06-15 ENCOUNTER — Encounter: Payer: Self-pay | Admitting: Physician Assistant

## 2023-06-15 LAB — AFP TUMOR MARKER: AFP, Serum, Tumor Marker: 4.8 ng/mL (ref 0.0–6.9)

## 2023-06-16 ENCOUNTER — Encounter: Payer: Self-pay | Admitting: Physician Assistant

## 2023-06-16 ENCOUNTER — Other Ambulatory Visit (HOSPITAL_COMMUNITY): Payer: Self-pay | Admitting: Gastroenterology

## 2023-06-16 ENCOUNTER — Other Ambulatory Visit: Payer: Self-pay | Admitting: Physician Assistant

## 2023-06-16 DIAGNOSIS — R7989 Other specified abnormal findings of blood chemistry: Secondary | ICD-10-CM

## 2023-06-16 MED ORDER — LORAZEPAM 0.5 MG PO TABS: 0.5000 mg | ORAL_TABLET | ORAL | 0 refills | Status: DC | PRN

## 2023-06-18 DIAGNOSIS — F4322 Adjustment disorder with anxiety: Secondary | ICD-10-CM | POA: Diagnosis not present

## 2023-06-21 ENCOUNTER — Other Ambulatory Visit: Payer: No Typology Code available for payment source

## 2023-06-22 ENCOUNTER — Ambulatory Visit (HOSPITAL_BASED_OUTPATIENT_CLINIC_OR_DEPARTMENT_OTHER)
Admission: RE | Admit: 2023-06-22 | Discharge: 2023-06-22 | Disposition: A | Discharge status: Home / Self Care | Payer: BC Managed Care – PPO | Source: Ambulatory Visit | Attending: Physician Assistant | Admitting: Physician Assistant

## 2023-06-22 ENCOUNTER — Inpatient Hospital Stay: Payer: BC Managed Care – PPO

## 2023-06-22 DIAGNOSIS — F1410 Cocaine abuse, uncomplicated: Secondary | ICD-10-CM | POA: Insufficient documentation

## 2023-06-22 DIAGNOSIS — Z87891 Personal history of nicotine dependence: Secondary | ICD-10-CM | POA: Insufficient documentation

## 2023-06-22 DIAGNOSIS — K760 Fatty (change of) liver, not elsewhere classified: Secondary | ICD-10-CM | POA: Diagnosis not present

## 2023-06-22 DIAGNOSIS — Z8719 Personal history of other diseases of the digestive system: Secondary | ICD-10-CM | POA: Diagnosis not present

## 2023-06-22 DIAGNOSIS — F17210 Nicotine dependence, cigarettes, uncomplicated: Secondary | ICD-10-CM | POA: Diagnosis not present

## 2023-06-22 DIAGNOSIS — F411 Generalized anxiety disorder: Secondary | ICD-10-CM | POA: Diagnosis not present

## 2023-06-22 LAB — CBC WITH DIFFERENTIAL (CANCER CENTER ONLY)
Abs Immature Granulocytes: 0.03 10*3/uL (ref 0.00–0.07)
Basophils Absolute: 0.1 10*3/uL (ref 0.0–0.1)
Basophils Relative: 1 %
Eosinophils Absolute: 0.1 10*3/uL (ref 0.0–0.5)
Eosinophils Relative: 2 %
HCT: 47.9 % (ref 39.0–52.0)
Hemoglobin: 16.8 g/dL (ref 13.0–17.0)
Immature Granulocytes: 1 %
Lymphocytes Relative: 40 %
Lymphs Abs: 2.2 10*3/uL (ref 0.7–4.0)
MCH: 33.4 pg (ref 26.0–34.0)
MCHC: 35.1 g/dL (ref 30.0–36.0)
MCV: 95.2 fL (ref 80.0–100.0)
Monocytes Absolute: 0.4 10*3/uL (ref 0.1–1.0)
Monocytes Relative: 7 %
Neutro Abs: 2.7 10*3/uL (ref 1.7–7.7)
Neutrophils Relative %: 49 %
Platelet Count: 218 10*3/uL (ref 150–400)
RBC: 5.03 MIL/uL (ref 4.22–5.81)
RDW: 11.9 % (ref 11.5–15.5)
WBC Count: 5.5 10*3/uL (ref 4.0–10.5)
nRBC: 0 % (ref 0.0–0.2)

## 2023-06-22 LAB — CMP (CANCER CENTER ONLY)
ALT: 174 U/L — ABNORMAL HIGH (ref 0–44)
AST: 57 U/L — ABNORMAL HIGH (ref 15–41)
Albumin: 4.2 g/dL (ref 3.5–5.0)
Alkaline Phosphatase: 71 U/L (ref 38–126)
Anion gap: 8 (ref 5–15)
BUN: 13 mg/dL (ref 6–20)
CO2: 23 mmol/L (ref 22–32)
Calcium: 8.9 mg/dL (ref 8.9–10.3)
Chloride: 107 mmol/L (ref 98–111)
Creatinine: 0.92 mg/dL (ref 0.61–1.24)
GFR, Estimated: >60 mL/min (ref >60)
Glucose, Bld: 132 mg/dL — ABNORMAL HIGH (ref 70–99)
Potassium: 4.1 mmol/L (ref 3.5–5.1)
Sodium: 138 mmol/L (ref 135–145)
Total Bilirubin: 0.9 mg/dL (ref 0.3–1.2)
Total Protein: 6.4 g/dL — ABNORMAL LOW (ref 6.5–8.1)

## 2023-06-22 LAB — HEMOCHROMATOSIS DNA-PCR(C282Y,H63D)

## 2023-06-22 LAB — ECHOCARDIOGRAM COMPLETE
Area-P 1/2: 2.92 cm2
S' Lateral: 2.7 cm
Single Plane A4C EF: 53.9 %

## 2023-06-22 LAB — FERRITIN: Ferritin: 1594 ng/mL — ABNORMAL HIGH (ref 24–336)

## 2023-06-22 NOTE — Progress Notes (Signed)
  Echocardiogram 2D Echocardiogram has been performed.  Milda Smart 06/22/2023, 1:44 PM

## 2023-06-22 NOTE — Progress Notes (Signed)
Frank Huerta presents today for phlebotomy per MD orders. Pt's fiance chairside during procedure. Phlebotomy procedure started at 0920 and ended at 0926. 70 G phlebotomy kit to the [facility] was used.  517 grams removed. Prior to the start of the procedure Pt requested to not be informed about anything regarding the process/procedure prior, during, or after d/t high anxiety. Pt was reclined in the infusion chair, TV was covered, Pt was provided with a towel to cover eyes, Pt had ear buds in his ears listening to ocean sounds, and Pt was turned away from PIV site towards the right. After obtaining access PIV site was covered with gauze. Patient observed for 30 minutes after procedure. Upon completion of procedure, Patient had c/o feeling lightheaded, upon visual assessment Patient had increased pallor. This RN laid Pt flat in infusion chair and cool cloth was placed on Patient's forehead. At 0935 Pt's coloring was baseline, and Pt stated, "I feel better, can I sit up?" Patient tolerated procedure better than initial phlebotomy on 06/14/2023. IV needle removed intact. Pt provided snack. Pt remained anxious, but more calm than previous phlebotomy and alert throughout entire procedure.  Pt VSS at discharge & Pt ambulatory to lobby without complaint.

## 2023-06-22 NOTE — Patient Instructions (Signed)

## 2023-06-23 DIAGNOSIS — F4322 Adjustment disorder with anxiety: Secondary | ICD-10-CM | POA: Diagnosis not present

## 2023-06-29 ENCOUNTER — Ambulatory Visit (HOSPITAL_BASED_OUTPATIENT_CLINIC_OR_DEPARTMENT_OTHER)
Admission: RE | Admit: 2023-06-29 | Payer: BC Managed Care – PPO | Source: Home / Self Care | Admitting: Orthopedic Surgery

## 2023-06-29 ENCOUNTER — Encounter (HOSPITAL_BASED_OUTPATIENT_CLINIC_OR_DEPARTMENT_OTHER): Admission: RE | Payer: Self-pay | Source: Home / Self Care

## 2023-06-29 DIAGNOSIS — R945 Abnormal results of liver function studies: Secondary | ICD-10-CM | POA: Diagnosis not present

## 2023-06-29 SURGERY — ARTHROTOMY
Anesthesia: Choice | Laterality: Left

## 2023-06-30 ENCOUNTER — Inpatient Hospital Stay: Payer: BC Managed Care – PPO

## 2023-06-30 ENCOUNTER — Inpatient Hospital Stay: Payer: BC Managed Care – PPO | Attending: Physician Assistant

## 2023-06-30 LAB — CMP (CANCER CENTER ONLY)
ALT: 127 U/L — ABNORMAL HIGH (ref 0–44)
AST: 58 U/L — ABNORMAL HIGH (ref 15–41)
Albumin: 4.2 g/dL (ref 3.5–5.0)
Alkaline Phosphatase: 68 U/L (ref 38–126)
Anion gap: 8 (ref 5–15)
BUN: 15 mg/dL (ref 6–20)
CO2: 24 mmol/L (ref 22–32)
Calcium: 9.1 mg/dL (ref 8.9–10.3)
Chloride: 107 mmol/L (ref 98–111)
Creatinine: 1 mg/dL (ref 0.61–1.24)
GFR, Estimated: >60 mL/min
Glucose, Bld: 104 mg/dL — ABNORMAL HIGH (ref 70–99)
Potassium: 4 mmol/L (ref 3.5–5.1)
Sodium: 139 mmol/L (ref 135–145)
Total Bilirubin: 0.8 mg/dL (ref 0.3–1.2)
Total Protein: 6.4 g/dL — ABNORMAL LOW (ref 6.5–8.1)

## 2023-06-30 LAB — CBC WITH DIFFERENTIAL (CANCER CENTER ONLY)
Abs Immature Granulocytes: 0.02 10*3/uL (ref 0.00–0.07)
Basophils Absolute: 0 10*3/uL (ref 0.0–0.1)
Basophils Relative: 1 %
Eosinophils Absolute: 0.2 10*3/uL (ref 0.0–0.5)
Eosinophils Relative: 4 %
HCT: 44.6 % (ref 39.0–52.0)
Hemoglobin: 15.7 g/dL (ref 13.0–17.0)
Immature Granulocytes: 0 %
Lymphocytes Relative: 42 %
Lymphs Abs: 2.1 10*3/uL (ref 0.7–4.0)
MCH: 33.3 pg (ref 26.0–34.0)
MCHC: 35.2 g/dL (ref 30.0–36.0)
MCV: 94.7 fL (ref 80.0–100.0)
Monocytes Absolute: 0.4 10*3/uL (ref 0.1–1.0)
Monocytes Relative: 8 %
Neutro Abs: 2.3 10*3/uL (ref 1.7–7.7)
Neutrophils Relative %: 45 %
Platelet Count: 191 10*3/uL (ref 150–400)
RBC: 4.71 MIL/uL (ref 4.22–5.81)
RDW: 11.8 % (ref 11.5–15.5)
WBC Count: 5.1 10*3/uL (ref 4.0–10.5)
nRBC: 0 % (ref 0.0–0.2)

## 2023-06-30 LAB — FERRITIN: Ferritin: 1425 ng/mL — ABNORMAL HIGH (ref 24–336)

## 2023-06-30 NOTE — Progress Notes (Signed)
Linda Hedges presents today for phlebotomy per MD orders. Significant other present chairside during procedure.  Phlebotomy procedure started at 0819 and ended at 0825. A 16 G Phlebotomy kit to the Left [facility] was used. 525 grams removed. Prior to the start of the procedure Pt requested to not be informed about anything regarding the process/procedure prior, during, or after d/t high anxiety. Pt was reclined in the infusion chair, TV was covered, Pt covered eyes with hoodie, Pt had ear buds in his ears listening to ocean sounds, and Pt was turned away from PIV site towards the right. After obtaining access PIV site was covered with gauze. Upon completion of procedure, Patient had c/o feeling lightheaded, this RN laid Pt flat in infusion chair and cool cloth was placed on Patient's forehead. After 8 minutes Pt informed this RN and Equities trader that he felt better, and sat himself up in infusion chair.  Patient observed for 15 minutes after procedure d/t Pt request. Pt informed this RN that he did not want to stay for full 30 minutes.  Patient tolerated procedure well. IV needle removed intact. Ambulatory to lobby with significant at discharge.  VSS at discharge.

## 2023-06-30 NOTE — Patient Instructions (Signed)

## 2023-07-01 DIAGNOSIS — F4322 Adjustment disorder with anxiety: Secondary | ICD-10-CM | POA: Diagnosis not present

## 2023-07-05 ENCOUNTER — Inpatient Hospital Stay: Payer: BC Managed Care – PPO

## 2023-07-05 DIAGNOSIS — G4733 Obstructive sleep apnea (adult) (pediatric): Secondary | ICD-10-CM | POA: Diagnosis not present

## 2023-07-05 DIAGNOSIS — Z713 Dietary counseling and surveillance: Secondary | ICD-10-CM | POA: Diagnosis not present

## 2023-07-05 DIAGNOSIS — F17299 Nicotine dependence, other tobacco product, with unspecified nicotine-induced disorders: Secondary | ICD-10-CM | POA: Diagnosis not present

## 2023-07-05 DIAGNOSIS — R030 Elevated blood-pressure reading, without diagnosis of hypertension: Secondary | ICD-10-CM | POA: Diagnosis not present

## 2023-07-05 LAB — CBC WITH DIFFERENTIAL (CANCER CENTER ONLY)
Abs Immature Granulocytes: 0.01 10*3/uL (ref 0.00–0.07)
Basophils Absolute: 0 10*3/uL (ref 0.0–0.1)
Basophils Relative: 1 %
Eosinophils Absolute: 0.3 10*3/uL (ref 0.0–0.5)
Eosinophils Relative: 5 %
HCT: 43.1 % (ref 39.0–52.0)
Hemoglobin: 15.1 g/dL (ref 13.0–17.0)
Immature Granulocytes: 0 %
Lymphocytes Relative: 43 %
Lymphs Abs: 2 10*3/uL (ref 0.7–4.0)
MCH: 33.6 pg (ref 26.0–34.0)
MCHC: 35 g/dL (ref 30.0–36.0)
MCV: 95.8 fL (ref 80.0–100.0)
Monocytes Absolute: 0.4 10*3/uL (ref 0.1–1.0)
Monocytes Relative: 7 %
Neutro Abs: 2.1 10*3/uL (ref 1.7–7.7)
Neutrophils Relative %: 44 %
Platelet Count: 176 10*3/uL (ref 150–400)
RBC: 4.5 MIL/uL (ref 4.22–5.81)
RDW: 12.2 % (ref 11.5–15.5)
WBC Count: 4.8 10*3/uL (ref 4.0–10.5)
nRBC: 0 % (ref 0.0–0.2)

## 2023-07-05 LAB — FERRITIN: Ferritin: 1382 ng/mL — ABNORMAL HIGH (ref 24–336)

## 2023-07-05 LAB — CMP (CANCER CENTER ONLY)
ALT: 117 U/L — ABNORMAL HIGH (ref 0–44)
AST: 51 U/L — ABNORMAL HIGH (ref 15–41)
Albumin: 4.2 g/dL (ref 3.5–5.0)
Alkaline Phosphatase: 59 U/L (ref 38–126)
Anion gap: 6 (ref 5–15)
BUN: 13 mg/dL (ref 6–20)
CO2: 25 mmol/L (ref 22–32)
Calcium: 9.2 mg/dL (ref 8.9–10.3)
Chloride: 107 mmol/L (ref 98–111)
Creatinine: 1.01 mg/dL (ref 0.61–1.24)
GFR, Estimated: >60 mL/min (ref >60)
Glucose, Bld: 112 mg/dL — ABNORMAL HIGH (ref 70–99)
Potassium: 3.8 mmol/L (ref 3.5–5.1)
Sodium: 138 mmol/L (ref 135–145)
Total Bilirubin: 1.2 mg/dL (ref 0.3–1.2)
Total Protein: 6.6 g/dL (ref 6.5–8.1)

## 2023-07-05 NOTE — Progress Notes (Signed)
Frank Huerta presents today for phlebotomy per MD orders. Phlebotomy procedure started at 0909 and ended at 0914. A 16 G phlebotomy kit to the [facility] was used for procedure.  530 grams removed. Prior to the start of the procedure Pt requested to not be informed about anything regarding the process/procedure prior, during, or after d/t high anxiety. Pt was reclined in the infusion chair, TV was covered, Pt covered eyes with hoodie, Pt had ear buds in his ears listening to ocean sounds, and Pt was turned away from PIV site towards the left. After obtaining access PIV site was covered with gauze. Upon completion of procedure, Pt laid flat in infusion chair with a cool cloth over eyes per Pt request.  Pt provided beverage, but declined snack.  Patient observed for 15 minutes after procedure without any incident. Patient tolerated procedure well. IV needle removed intact. Ambulatory to lobby without complaint.

## 2023-07-05 NOTE — Patient Instructions (Signed)

## 2023-07-06 ENCOUNTER — Other Ambulatory Visit: Payer: Self-pay | Admitting: Radiology

## 2023-07-06 DIAGNOSIS — R7989 Other specified abnormal findings of blood chemistry: Secondary | ICD-10-CM

## 2023-07-06 NOTE — Progress Notes (Signed)
Chief Complaint: Patient was seen in consultation today for image guided random core liver biopsy  Referring Physician(s): Brahmbhatt,Parag  Supervising Physician: Oley Balm  Patient Status: [facility] - Out-pt  History of Present Illness: Frank Huerta is a 42 y.o. male smoker with past medical history significant for attention deficit disorder, cocaine abuse, generalized anxiety disorder, hereditary hemochromatosis, alcohol use who presents now with elevated LFTs, recent US abd in April of this year showing hepatomegaly and fatty infiltration of liver.  He presents today for image guided random core liver biopsy for further evaluation.  Past Medical History:  Diagnosis Date   Attention deficit disorder (ADD)    Cocaine abuse (HCC)    GAD (generalized anxiety disorder)    Hemorrhoids    History of condyloma acuminatum 11/2003   urethral    Past Surgical History:  Procedure Laterality Date   URETHROPLASTY  12/12/2003   @[facility]  by dr s. Patsi Sears;   meatotomy,  excision wart distal urethral, cystoscopy, and urethroplasty    Allergies: Patient has no known allergies.  Medications: Prior to Admission medications   Medication Sig Start Date End Date Taking? Authorizing Provider  amphetamine-dextroamphetamine (ADDERALL) 10 MG tablet Take 5 mg by mouth daily as needed.    [provider]  LORazepam (ATIVAN) 0.5 MG tablet Take 1 tablet (0.5 mg total) by mouth as needed for anxiety (before weekly phlebotomy). 06/16/23   Briant Cedar, PA-C  oxyCODONE-acetaminophen (PERCOCET) 5-325 MG tablet Take 1 tablet by mouth every 4 (four) hours as needed. Patient not taking: Reported on 06/11/2023 09/28/22   Garlon Hatchet, PA-C  valACYclovir (VALTREX) 1000 MG tablet Take 1 tablet (1,000 mg total) by mouth 2 (two) times daily. 06/22/18   Ofilia Neas, PA-C     No family history on file.  Social History   Socioeconomic History   Marital status: Single    Spouse name: Not  on file   Number of children: Not on file   Years of education: Not on file   Highest education level: Not on file  Occupational History   Not on file  Tobacco Use   Smoking status: Every Day    Types: E-cigarettes   Smokeless tobacco: Never  Vaping Use   Vaping status: Every Day  Substance and Sexual Activity   Alcohol use: Yes    Comment: three times weekly   Drug use: Yes    Types: Cocaine   Sexual activity: Not on file  Other Topics Concern   Not on file  Social History Narrative   Not on file   Social Determinants of Health   Financial Resource Strain: Not on file  Food Insecurity: Not on file  Transportation Needs: Not on file  Physical Activity: Not on file  Stress: Not on file  Social Connections: Not on file      Review of Systems: denies fever,HA,CP,dyspnea, cough, abd /back pain,N/V or bleeding; he is very anxious  Vital Signs: Vitals:   07/07/23 1142  BP: (!) 165/91  Pulse: 72  Resp: 18  Temp: 98.4 F (36.9 C)  SpO2: 98%      Code Status: FULL CODE    Physical Exam: awake/alert; chest- CTA bilat; heart- RRR; abd- soft,+BS,NT; no sig LE edema  Imaging: ECHOCARDIOGRAM COMPLETE  Result Date: 06/22/2023    ECHOCARDIOGRAM REPORT   Patient Name:   Frank Huerta Date of Exam: 06/22/2023 Medical Rec #:  [id number]    Height:  76.0 in Accession #:    [id number]   Weight:       214.5 lb Date of Birth:  1981-01-26     BSA:          2.282 m Patient Age:    42 years     BP:           126/85 mmHg Patient Gender: M            HR:           75 bpm. Exam Location:  Outpatient Procedure: 2D Echo, Color Doppler and Cardiac Doppler Indications:    Hereditary hemochromatosis  History:        Patient has no prior history of Echocardiogram examinations.                 Risk Factors:Former Smoker. Hx of cocaine abuse.  Sonographer:    Milda Smart Referring Phys: [id number] IRENE T THAYIL IMPRESSIONS  1. Mild intracavitary gradient. Peak velocity 0.9 m/s. Peak gradient  3.3 mmHg. Left ventricular ejection fraction, by estimation, is 65 to 70%. The left ventricle has normal function. The left ventricle has no regional wall motion abnormalities. Left ventricular diastolic parameters are consistent with Grade I diastolic dysfunction (impaired relaxation).  2. Right ventricular systolic function is normal. The right ventricular size is normal. There is normal pulmonary artery systolic pressure.  3. The mitral valve is normal in structure. No evidence of mitral valve regurgitation. No evidence of mitral stenosis.  4. The aortic valve is tricuspid. Aortic valve regurgitation is not visualized. No aortic stenosis is present.  5. The inferior vena cava is normal in size with greater than 50% respiratory variability, suggesting right atrial pressure of 3 mmHg. FINDINGS  Left Ventricle: Mild intracavitary gradient. Peak velocity 0.9 m/s. Peak gradient 3.3 mmHg. Left ventricular ejection fraction, by estimation, is 65 to 70%. The left ventricle has normal function. The left ventricle has no regional wall motion abnormalities. The left ventricular internal cavity size was normal in size. There is no left ventricular hypertrophy. Left ventricular diastolic parameters are consistent with Grade I diastolic dysfunction (impaired relaxation). Normal left ventricular filling pressure. Right Ventricle: The right ventricular size is normal. No increase in right ventricular wall thickness. Right ventricular systolic function is normal. There is normal pulmonary artery systolic pressure. The tricuspid regurgitant velocity is 2.52 m/s, and  with an assumed right atrial pressure of 3 mmHg, the estimated right ventricular systolic pressure is 28.4 mmHg. Left Atrium: Left atrial size was normal in size. Right Atrium: Right atrial size was normal in size. Pericardium: There is no evidence of pericardial effusion. Mitral Valve: The mitral valve is normal in structure. No evidence of mitral valve  regurgitation. No evidence of mitral valve stenosis. Tricuspid Valve: The tricuspid valve is normal in structure. Tricuspid valve regurgitation is trivial. No evidence of tricuspid stenosis. Aortic Valve: The aortic valve is tricuspid. Aortic valve regurgitation is not visualized. No aortic stenosis is present. Pulmonic Valve: The pulmonic valve was normal in structure. Pulmonic valve regurgitation is trivial. No evidence of pulmonic stenosis. Aorta: The aortic root is normal in size and structure. Venous: The inferior vena cava is normal in size with greater than 50% respiratory variability, suggesting right atrial pressure of 3 mmHg. IAS/Shunts: No atrial level shunt detected by color flow Doppler.  LEFT VENTRICLE PLAX 2D LVIDd:         4.30 cm     Diastology LVIDs:  2.70 cm     LV e' medial:    6.42 cm/s LV PW:         1.20 cm     LV E/e' medial:  8.6 LV IVS:        0.90 cm     LV e' lateral:   8.38 cm/s LVOT diam:     2.40 cm     LV E/e' lateral: 6.6 LV SV:         71 LV SV Index:   31 LVOT Area:     4.52 cm  LV Volumes (MOD) LV vol d, MOD A4C: 90.4 ml LV vol s, MOD A4C: 41.7 ml LV SV MOD A4C:     90.4 ml RIGHT VENTRICLE RV Basal diam:  3.80 cm RV S prime:     15.30 cm/s TAPSE (M-mode): 2.7 cm LEFT ATRIUM             Index        RIGHT ATRIUM           Index LA diam:        3.60 cm 1.58 cm/m   RA Area:     12.80 cm LA Vol (A2C):   45.7 ml 20.03 ml/m  RA Volume:   27.80 ml  12.18 ml/m LA Vol (A4C):   37.4 ml 16.39 ml/m LA Biplane Vol: 42.7 ml 18.71 ml/m  AORTIC VALVE             PULMONIC VALVE LVOT Vmax:   87.20 cm/s  PR End Diast Vel: 4.41 msec LVOT Vmean:  70.600 cm/s LVOT VTI:    0.158 m  AORTA Ao Root diam: 3.10 cm Ao Asc diam:  3.60 cm MITRAL VALVE               TRICUSPID VALVE MV Area (PHT): 2.92 cm    TR Peak grad:   25.4 mmHg MV Decel Time: 260 msec    TR Vmax:        252.00 cm/s MV E velocity: 55.50 cm/s MV A velocity: 60.00 cm/s  SHUNTS MV E/A ratio:  0.93        Systemic VTI:  0.16 m                             Systemic Diam: 2.40 cm Chilton Si MD Electronically signed by Chilton Si MD Signature Date/Time: 06/22/2023/6:19:22 PM    Final     Labs:  CBC: Recent Labs    06/14/23 1355 06/22/23 0807 06/30/23 0739 07/05/23 0811  WBC 8.3 5.5 5.1 4.8  HGB 18.4* 16.8 15.7 15.1  HCT 51.4 47.9 44.6 43.1  PLT 175 218 191 176    COAGS: No results for input(s): "INR", "APTT" in the last 8760 hours.  BMP: Recent Labs    06/14/23 1355 06/22/23 0807 06/30/23 0739 07/05/23 0825  NA 140 138 139 138  K 4.3 4.1 4.0 3.8  CL 106 107 107 107  CO2 25 23 24 25   GLUCOSE 106* 132* 104* 112*  BUN 12 13 15 13   CALCIUM 9.9 8.9 9.1 9.2  CREATININE 0.93 0.92 1.00 1.01  GFRNONAA >60 >60 >60 >60    LIVER FUNCTION TESTS: Recent Labs    06/14/23 1355 06/22/23 0807 06/30/23 0739 07/05/23 0825  BILITOT 0.9 0.9 0.8 1.2  AST 317* 57* 58* 51*  ALT 723* 174* 127* 117*  ALKPHOS 77 71 68 59  PROT 7.0 6.4*  6.4* 6.6  ALBUMIN 4.6 4.2 4.2 4.2    TUMOR MARKERS: No results for input(s): "AFPTM", "CEA", "CA199", "CHROMGRNA" in the last 8760 hours.  Assessment and Plan: 42 y.o. male smoker with past medical history significant for attention deficit disorder, cocaine abuse, generalized anxiety disorder, hereditary hemochromatosis, alcohol use who presents now with elevated LFTs, recent US abd in April of this year showing hepatomegaly and fatty infiltration of liver.  He presents today for image guided random core liver biopsy for further evaluation.Risks and benefits of procedure was discussed with the patient including, but not limited to bleeding, infection, damage to adjacent structures or low yield requiring additional tests.  All of the questions were answered and there is agreement to proceed.  Consent signed and in chart.    Thank you for this interesting consult.  I greatly enjoyed meeting Frank Huerta and look forward to participating in their care.  A copy of this  report was sent to the requesting provider on this date.  Electronically Signed: D. Jeananne Rama, PA-C 07/06/2023, 12:21 PM   I spent a total of 25 minutes    in face to face in clinical consultation, greater than 50% of which was counseling/coordinating care for image guided random core liver biopsy

## 2023-07-07 ENCOUNTER — Encounter (HOSPITAL_COMMUNITY): Payer: Self-pay

## 2023-07-07 ENCOUNTER — Ambulatory Visit (HOSPITAL_COMMUNITY)
Admission: RE | Admit: 2023-07-07 | Discharge: 2023-07-07 | Disposition: A | Discharge status: Home / Self Care | Payer: BC Managed Care – PPO | Source: Ambulatory Visit | Attending: Gastroenterology | Admitting: Gastroenterology

## 2023-07-07 ENCOUNTER — Other Ambulatory Visit: Payer: Self-pay

## 2023-07-07 DIAGNOSIS — K760 Fatty (change of) liver, not elsewhere classified: Secondary | ICD-10-CM | POA: Insufficient documentation

## 2023-07-07 DIAGNOSIS — R7989 Other specified abnormal findings of blood chemistry: Secondary | ICD-10-CM | POA: Insufficient documentation

## 2023-07-07 DIAGNOSIS — R945 Abnormal results of liver function studies: Secondary | ICD-10-CM | POA: Diagnosis not present

## 2023-07-07 LAB — PROTIME-INR
INR: 1 (ref 0.8–1.2)
Prothrombin Time: 13.5 s (ref 11.4–15.2)

## 2023-07-07 MED ORDER — FENTANYL CITRATE (PF) 100 MCG/2ML IJ SOLN
INTRAMUSCULAR | Status: AC | PRN
Start: 2023-07-07 — End: 2023-07-07
  Administered 2023-07-07 (×2): 50 ug via INTRAVENOUS

## 2023-07-07 MED ORDER — HYDROCODONE-ACETAMINOPHEN 5-325 MG PO TABS: 1.0000 | ORAL_TABLET | ORAL | Status: DC | PRN

## 2023-07-07 MED ORDER — FENTANYL CITRATE (PF) 100 MCG/2ML IJ SOLN
INTRAMUSCULAR | Status: AC
  Filled 2023-07-07: qty 2

## 2023-07-07 MED ORDER — MIDAZOLAM HCL 2 MG/2ML IJ SOLN
INTRAMUSCULAR | Status: AC
  Filled 2023-07-07: qty 4

## 2023-07-07 MED ORDER — MIDAZOLAM HCL 2 MG/2ML IJ SOLN
INTRAMUSCULAR | Status: AC | PRN
Start: 2023-07-07 — End: 2023-07-07
  Administered 2023-07-07: 1 mg via INTRAVENOUS
  Administered 2023-07-07: 2 mg via INTRAVENOUS
  Administered 2023-07-07: 1 mg via INTRAVENOUS

## 2023-07-07 MED ORDER — LIDOCAINE HCL 1 % IJ SOLN
INTRAMUSCULAR | Status: AC
  Filled 2023-07-07: qty 20

## 2023-07-07 MED ORDER — SODIUM CHLORIDE 0.9 % IV SOLN: INTRAVENOUS | Status: DC

## 2023-07-07 NOTE — Procedures (Signed)
  Procedure:  [place] core biopsy liver   Preprocedure diagnosis: Diagnoses of Hemochromatosis, unspecified hemochromatosis type, Elevated liver function tests, and Elevated LFTs were pertinent to this visit. Postprocedure diagnosis: same EBL:    minimal Complications:   none immediate  See full dictation in [employer].  Thora Lance MD Main # [phone] Pager  [phone] Mobile [phone]

## 2023-07-07 NOTE — Discharge Instructions (Signed)
Liver Biopsy, Care After  May remove dressing or bandaid and shower tomorrow.  Keep site clean and dry. Replace with clean dressing or bandaid as necessary.   Urgent needs - Interventional Radiology clinic [phone]  After a liver biopsy, it is common to have these things in the area where the biopsy was done. You may: Have pain. Feel sore. Have bruising. You may also feel tired for a few days. Follow these instructions at home: Medicines Take over-the-counter and prescription medicines only as told by your doctor. If you were prescribed an antibiotic medicine, take it as told by your doctor. Do not stop taking the antibiotic, even if you start to feel better. Do not take medicines that may thin your blood. These medicines include aspirin and ibuprofen. Take them only if your doctor tells you to. If told, take steps to prevent problems with pooping (constipation). You may need to: Drink enough fluid to keep your pee (urine) pale yellow. Take medicines. You will be told what medicines to take. Eat foods that are high in fiber. These include beans, whole grains, and fresh fruits and vegetables. Limit foods that are high in fat and sugar. These include fried or sweet foods. Ask your doctor if you should avoid driving or using machines while you are taking your medicine. Caring for your incision Follow instructions from your doctor about how to take care of your cut from surgery (incisions). Make sure you: Wash your hands with soap and water for at least 20 seconds before and after you change your bandage. If you cannot use soap and water, use hand sanitizer. Change your bandage. Leavestitches or skin glue in place for at least two weeks. Leave tape strips alone unless you are told to take them off. You may trim the edges of the tape strips if they curl up. Check your incision every day for signs of infection. Check for: Redness, swelling, or more pain. Fluid or blood. Warmth. Pus or a  bad smell. Do not take baths, swim, or use a hot tub. Ask your doctor about taking showers or sponge baths. Activity Rest at home for 1-2 days, or as told by your doctor. Get up to take short walks every 1 to 2 hours. Ask for help if you feel weak or unsteady. Do not lift anything that is heavier than 10 lb (4.5 kg), or the limit that you are told. Do not play contact sports for 2 weeks after the procedure. Return to your normal activities as told by your doctor. Ask what activities are safe for you. General instructions Do not drink alcohol in the first week after the procedure. Plan to have a responsible adult care for you for the time you are told after you leave the hospital or clinic. This is important. It is up to you to get the results of your procedure. Ask how to get your results when they are ready. Keep all follow-up visits.   Contact a doctor if: You have more bleeding in your incision. Your incision swells, or is red and more painful. You have fluid that comes from your incision. You develop a rash. You have fever or chills. Get help right away if: You have swelling, bloating, or pain in your belly (abdomen). You get dizzy or faint. You vomit or you feel like vomiting. You have trouble breathing or feel short of breath. You have chest pain. You have problems talking or seeing. You have trouble with your balance or moving your arms or   legs. These symptoms may be an emergency. Get help right away. Call your local emergency services (911 in the [place] Do not wait to see if the symptoms will go away. Do not drive yourself to the hospital. Summary After the procedure, it is common to have pain, soreness, bruising, and tiredness. Your doctor will tell you how to take care of yourself at home. Change your bandage, take your medicines, and limit your activities as told by your doctor. Call your doctor if you have symptoms of infection. Get help right away if your belly swells,  your cut bleeds a lot, or you have trouble talking or breathing. This information is not intended to replace advice given to you by your health care provider. Make sure you discuss any questions you have with your healthcare provider. Document Revised: 08/26/2020 Document Reviewed: 08/26/2020 Elsevier Patient Education  2022 Elsevier Inc.  Moderate Conscious Sedation, Adult, Care After This sheet gives you information about how to care for yourself after your procedure. Your health care provider may also give you more specific instructions. If you have problems or questions, contact your health careprovider. What can I expect after the procedure? After the procedure, it is common to have: Sleepiness for several hours. Impaired judgment for several hours. Difficulty with balance. Vomiting if you eat too soon. Follow these instructions at home: For the time period you were told by your health care provider: Rest. Do not participate in activities where you could fall or become injured. Do not drive or use machinery. Do not drink alcohol. Do not take sleeping pills or medicines that cause drowsiness. Do not make important decisions or sign legal documents. Do not take care of children on your own. Eating and drinking  Follow the diet recommended by your health care provider. Drink enough fluid to keep your urine pale yellow. If you vomit: Drink water, juice, or soup when you can drink without vomiting. Make sure you have little or no nausea before eating solid foods.  General instructions Take over-the-counter and prescription medicines only as told by your health care provider. Have a responsible adult stay with you for the time you are told. It is important to have someone help care for you until you are awake and alert. Do not smoke. Keep all follow-up visits as told by your health care provider. This is important. Contact a health care provider if: You are still sleepy or having  trouble with balance after 24 hours. You feel light-headed. You keep feeling nauseous or you keep vomiting. You develop a rash. You have a fever. You have redness or swelling around the IV site. Get help right away if: You have trouble breathing. You have new-onset confusion at home. Summary After the procedure, it is common to feel sleepy, have impaired judgment, or feel nauseous if you eat too soon. Rest after you get home. Know the things you should not do after the procedure. Follow the diet recommended by your health care provider and drink enough fluid to keep your urine pale yellow. Get help right away if you have trouble breathing or new-onset confusion at home. This information is not intended to replace advice given to you by your health care provider. Make sure you discuss any questions you have with your healthcare provider. Document Revised: 02/09/2020 Document Reviewed: 09/07/2019 Elsevier Patient Education  2022 Elsevier Inc.        

## 2023-07-08 LAB — SURGICAL PATHOLOGY

## 2023-07-12 ENCOUNTER — Inpatient Hospital Stay: Payer: BC Managed Care – PPO

## 2023-07-12 ENCOUNTER — Other Ambulatory Visit: Payer: No Typology Code available for payment source

## 2023-07-12 LAB — CMP (CANCER CENTER ONLY)
ALT: 77 U/L — ABNORMAL HIGH (ref 0–44)
AST: 30 U/L (ref 15–41)
Albumin: 4.3 g/dL (ref 3.5–5.0)
Alkaline Phosphatase: 60 U/L (ref 38–126)
Anion gap: 7 (ref 5–15)
BUN: 18 mg/dL (ref 6–20)
CO2: 23 mmol/L (ref 22–32)
Calcium: 9.3 mg/dL (ref 8.9–10.3)
Chloride: 108 mmol/L (ref 98–111)
Creatinine: 1.06 mg/dL (ref 0.61–1.24)
GFR, Estimated: >60 mL/min (ref >60)
Glucose, Bld: 93 mg/dL (ref 70–99)
Potassium: 4 mmol/L (ref 3.5–5.1)
Sodium: 138 mmol/L (ref 135–145)
Total Bilirubin: 1.1 mg/dL (ref 0.3–1.2)
Total Protein: 6.6 g/dL (ref 6.5–8.1)

## 2023-07-12 LAB — CBC WITH DIFFERENTIAL (CANCER CENTER ONLY)
Abs Immature Granulocytes: 0.01 10*3/uL (ref 0.00–0.07)
Basophils Absolute: 0 10*3/uL (ref 0.0–0.1)
Basophils Relative: 1 %
Eosinophils Absolute: 0.2 10*3/uL (ref 0.0–0.5)
Eosinophils Relative: 5 %
HCT: 42.7 % (ref 39.0–52.0)
Hemoglobin: 14.8 g/dL (ref 13.0–17.0)
Immature Granulocytes: 0 %
Lymphocytes Relative: 38 %
Lymphs Abs: 1.7 10*3/uL (ref 0.7–4.0)
MCH: 33.2 pg (ref 26.0–34.0)
MCHC: 34.7 g/dL (ref 30.0–36.0)
MCV: 95.7 fL (ref 80.0–100.0)
Monocytes Absolute: 0.4 10*3/uL (ref 0.1–1.0)
Monocytes Relative: 8 %
Neutro Abs: 2.1 10*3/uL (ref 1.7–7.7)
Neutrophils Relative %: 48 %
Platelet Count: 177 10*3/uL (ref 150–400)
RBC: 4.46 MIL/uL (ref 4.22–5.81)
RDW: 12.4 % (ref 11.5–15.5)
WBC Count: 4.4 10*3/uL (ref 4.0–10.5)
nRBC: 0 % (ref 0.0–0.2)

## 2023-07-12 LAB — FERRITIN: Ferritin: 738 ng/mL — ABNORMAL HIGH (ref 24–336)

## 2023-07-12 NOTE — Progress Notes (Signed)
Frank Huerta presents today for phlebotomy per MD orders. Pt's significant other chairside during procedure.  Phlebotomy procedure started at 0914 and stopped at 0926, a 16 G phlebotomy kit to the L [facility] was used. Blood flow was sluggish and per Pt request needle was removed. Upon removal site was found to have clotted off. 82 g removed from L arm. At [phone] this RN restarted procedure per Pt request in the R [facility] using a 16 G phlebotomy kit. Procedure ended at [phone]. 449 g removed from R arm.  531 grams removed in total. Prior to the start of the procedure Pt requested to not be informed about anything regarding the process/procedure prior, during, or after d/t high anxiety. Pt was reclined in the infusion chair, TV was covered, Pt covered eyes with hoodie, Pt had ear buds in his ears listening to ocean sounds, and Pt was turned away from PIV site. After obtaining access PIV site was covered with gauze. Upon completion of procedure, Pt sat upright in infusion chair with a cool cloth over eyes per Pt request.  Pt declined all snacks and beverages. Patient observed for 10 minutes after procedure without any incident. Patient tolerated procedure well without incident. IV needles removed intact. Pt provided a copy of lab results. Ambulatory to lobby without complaint.

## 2023-07-12 NOTE — Patient Instructions (Signed)

## 2023-07-13 DIAGNOSIS — F4322 Adjustment disorder with anxiety: Secondary | ICD-10-CM | POA: Diagnosis not present

## 2023-07-14 DIAGNOSIS — F4322 Adjustment disorder with anxiety: Secondary | ICD-10-CM | POA: Diagnosis not present

## 2023-07-19 ENCOUNTER — Other Ambulatory Visit: Payer: No Typology Code available for payment source

## 2023-07-19 ENCOUNTER — Inpatient Hospital Stay: Payer: BC Managed Care – PPO

## 2023-07-19 DIAGNOSIS — F4322 Adjustment disorder with anxiety: Secondary | ICD-10-CM | POA: Diagnosis not present

## 2023-07-19 LAB — CBC WITH DIFFERENTIAL (CANCER CENTER ONLY)
Abs Immature Granulocytes: 0.02 10*3/uL (ref 0.00–0.07)
Basophils Absolute: 0.1 10*3/uL (ref 0.0–0.1)
Basophils Relative: 1 %
Eosinophils Absolute: 0.2 10*3/uL (ref 0.0–0.5)
Eosinophils Relative: 2 %
HCT: 42.4 % (ref 39.0–52.0)
Hemoglobin: 15.2 g/dL (ref 13.0–17.0)
Immature Granulocytes: 0 %
Lymphocytes Relative: 29 %
Lymphs Abs: 1.9 10*3/uL (ref 0.7–4.0)
MCH: 34.1 pg — ABNORMAL HIGH (ref 26.0–34.0)
MCHC: 35.8 g/dL (ref 30.0–36.0)
MCV: 95.1 fL (ref 80.0–100.0)
Monocytes Absolute: 0.5 10*3/uL (ref 0.1–1.0)
Monocytes Relative: 7 %
Neutro Abs: 4.1 10*3/uL (ref 1.7–7.7)
Neutrophils Relative %: 61 %
Platelet Count: 183 10*3/uL (ref 150–400)
RBC: 4.46 MIL/uL (ref 4.22–5.81)
RDW: 12.5 % (ref 11.5–15.5)
WBC Count: 6.7 10*3/uL (ref 4.0–10.5)
nRBC: 0 % (ref 0.0–0.2)

## 2023-07-19 LAB — CMP (CANCER CENTER ONLY)
ALT: 61 U/L — ABNORMAL HIGH (ref 0–44)
AST: 30 U/L (ref 15–41)
Albumin: 4.5 g/dL (ref 3.5–5.0)
Alkaline Phosphatase: 61 U/L (ref 38–126)
Anion gap: 7 (ref 5–15)
BUN: 13 mg/dL (ref 6–20)
CO2: 25 mmol/L (ref 22–32)
Calcium: 9.8 mg/dL (ref 8.9–10.3)
Chloride: 107 mmol/L (ref 98–111)
Creatinine: 1.07 mg/dL (ref 0.61–1.24)
GFR, Estimated: >60 mL/min (ref >60)
Glucose, Bld: 89 mg/dL (ref 70–99)
Potassium: 3.9 mmol/L (ref 3.5–5.1)
Sodium: 139 mmol/L (ref 135–145)
Total Bilirubin: 1.1 mg/dL (ref 0.3–1.2)
Total Protein: 6.8 g/dL (ref 6.5–8.1)

## 2023-07-19 LAB — FERRITIN: Ferritin: 649 ng/mL — ABNORMAL HIGH (ref 24–336)

## 2023-07-19 NOTE — Patient Instructions (Signed)

## 2023-07-19 NOTE — Progress Notes (Signed)
Linda Hedges presents today for phlebotomy per MD orders. Phlebotomy procedure started at 0935 and ended at [phone]. A 16 G phlebotomy kit to the R [facility] was used.  532 grams removed. Prior to the start of the procedure Pt requested to not be informed about anything regarding the process/procedure prior, during, or after d/t high anxiety. Pt was reclined in the infusion chair, Pt covered eyes with hoodie, Pt had ear buds in his ears listening to ocean sounds, and Pt was turned away from PIV site. After obtaining access PIV site was covered with gauze.  Upon completion of procedure, Pt requested to lay back and cover eyes with cool washcloth.  Patient observed for 10 minutes after procedure (per request) without any incident. Patient declined all snacks and beverages.  Patient tolerated procedure well. IV needle removed intact. Ambulatory to lobby without complaint.

## 2023-07-20 DIAGNOSIS — F4322 Adjustment disorder with anxiety: Secondary | ICD-10-CM | POA: Diagnosis not present

## 2023-07-23 DIAGNOSIS — S43142D Inferior dislocation of left acromioclavicular joint, subsequent encounter: Secondary | ICD-10-CM | POA: Diagnosis not present

## 2023-07-26 ENCOUNTER — Other Ambulatory Visit: Payer: Self-pay | Admitting: Physician Assistant

## 2023-07-27 ENCOUNTER — Inpatient Hospital Stay: Payer: BC Managed Care – PPO

## 2023-07-27 ENCOUNTER — Inpatient Hospital Stay: Payer: BC Managed Care – PPO | Admitting: Physician Assistant

## 2023-07-27 DIAGNOSIS — S43142D Inferior dislocation of left acromioclavicular joint, subsequent encounter: Secondary | ICD-10-CM | POA: Diagnosis not present

## 2023-07-28 ENCOUNTER — Inpatient Hospital Stay: Payer: BC Managed Care – PPO | Attending: Physician Assistant

## 2023-07-28 ENCOUNTER — Inpatient Hospital Stay (HOSPITAL_BASED_OUTPATIENT_CLINIC_OR_DEPARTMENT_OTHER): Payer: BC Managed Care – PPO | Admitting: Physician Assistant

## 2023-07-28 ENCOUNTER — Inpatient Hospital Stay: Payer: BC Managed Care – PPO

## 2023-07-28 DIAGNOSIS — F17290 Nicotine dependence, other tobacco product, uncomplicated: Secondary | ICD-10-CM | POA: Insufficient documentation

## 2023-07-28 LAB — CBC WITH DIFFERENTIAL (CANCER CENTER ONLY)
Abs Immature Granulocytes: 0.02 10*3/uL (ref 0.00–0.07)
Basophils Absolute: 0 10*3/uL (ref 0.0–0.1)
Basophils Relative: 0 %
Eosinophils Absolute: 0 10*3/uL (ref 0.0–0.5)
Eosinophils Relative: 0 %
HCT: 44.5 % (ref 39.0–52.0)
Hemoglobin: 15.9 g/dL (ref 13.0–17.0)
Immature Granulocytes: 0 %
Lymphocytes Relative: 27 %
Lymphs Abs: 1.9 10*3/uL (ref 0.7–4.0)
MCH: 33.8 pg (ref 26.0–34.0)
MCHC: 35.7 g/dL (ref 30.0–36.0)
MCV: 94.7 fL (ref 80.0–100.0)
Monocytes Absolute: 0.4 10*3/uL (ref 0.1–1.0)
Monocytes Relative: 6 %
Neutro Abs: 4.6 10*3/uL (ref 1.7–7.7)
Neutrophils Relative %: 67 %
Platelet Count: 226 10*3/uL (ref 150–400)
RBC: 4.7 MIL/uL (ref 4.22–5.81)
RDW: 12.6 % (ref 11.5–15.5)
WBC Count: 6.9 10*3/uL (ref 4.0–10.5)
nRBC: 0 % (ref 0.0–0.2)

## 2023-07-28 LAB — CMP (CANCER CENTER ONLY)
ALT: 64 U/L — ABNORMAL HIGH (ref 0–44)
AST: 30 U/L (ref 15–41)
Albumin: 5 g/dL (ref 3.5–5.0)
Alkaline Phosphatase: 60 U/L (ref 38–126)
Anion gap: 7 (ref 5–15)
BUN: 12 mg/dL (ref 6–20)
CO2: 25 mmol/L (ref 22–32)
Calcium: 10.4 mg/dL — ABNORMAL HIGH (ref 8.9–10.3)
Chloride: 106 mmol/L (ref 98–111)
Creatinine: 0.96 mg/dL (ref 0.61–1.24)
GFR, Estimated: >60 mL/min (ref >60)
Glucose, Bld: 98 mg/dL (ref 70–99)
Potassium: 4 mmol/L (ref 3.5–5.1)
Sodium: 138 mmol/L (ref 135–145)
Total Bilirubin: 1.2 mg/dL (ref 0.3–1.2)
Total Protein: 7.6 g/dL (ref 6.5–8.1)

## 2023-07-28 LAB — FERRITIN: Ferritin: 541 ng/mL — ABNORMAL HIGH (ref 24–336)

## 2023-07-28 NOTE — Progress Notes (Signed)
Frank Huerta presents today for phlebotomy per MD orders. Phlebotomy procedure started at 1356 and ended at 1400; 16g to [facility].  542 grams removed. Patient observed for 30 minutes after procedure without any incident. Patient tolerated procedure well. IV needle removed intact.

## 2023-07-28 NOTE — Progress Notes (Signed)
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   HISTORY OF PRESENTING ILLNESS:  Frank Huerta 42 y.o. male returns for a follow up for hereditary hemochromatosis. He was last seen on 06/11/2023 to establish care. In the interim, he has started weekly therapeutic phlebotomies. He is unaccompanied for this visit.   On exam today, Frank Huerta reports he is doing well since starting phlebotomies. He has cut out red meat, seafood and other iron rich foods. In addition, he has stopped drinking alcohol except for last Saturday when he consumed 2 drinks. He reports no fatigue and can complete all his ADLs on his own. He denies nausea, vomiting or bowel habit changes. He denies easy bruising or signs of bleeding. He denies fevers, chills, sweats, shortness of breath, chest pain or cough. He has no other complaints. Rest of the ROS is below.   MEDICAL HISTORY:  Past Medical History:  Diagnosis Date   Attention deficit disorder (ADD)    Cocaine abuse (HCC)    GAD (generalized anxiety disorder)    Hemorrhoids    History of condyloma acuminatum 11/2003   urethral    SURGICAL HISTORY: Past Surgical History:  Procedure Laterality Date   URETHROPLASTY  12/12/2003   @[facility]  by dr s. Patsi Sears;   meatotomy,  excision wart distal urethral, cystoscopy, and urethroplasty    SOCIAL HISTORY: Social History   Socioeconomic History   Marital status: Single    Spouse name: Not on file   Number of children: Not on file   Years of education: Not on file   Highest education level: Not on file  Occupational History   Not on file  Tobacco Use   Smoking status: Every Day    Types: E-cigarettes   Smokeless tobacco:  Never  Vaping Use   Vaping status: Every Day  Substance and Sexual Activity   Alcohol use: Yes    Comment: three times weekly   Drug use: Yes    Types: Cocaine   Sexual activity: Not on file  Other Topics Concern   Not on file  Social History Narrative   Not on file   Social Determinants of Health   Financial Resource Strain: Not on file  Food Insecurity: Not on file  Transportation Needs: Not on file  Physical Activity: Not on file  Stress: Not on file  Social Connections: Not on file  Intimate Partner Violence: Not on file    FAMILY HISTORY: No family history on file.  ALLERGIES:  has No Known Allergies.  MEDICATIONS:  Current Outpatient Medications  Medication Sig Dispense Refill   amphetamine-dextroamphetamine (ADDERALL) 10 MG tablet Take 5 mg by mouth daily as needed. (Patient not taking: Reported on 07/28/2023)     LORazepam (ATIVAN) 0.5 MG tablet Take 1 tablet (0.5 mg total) by mouth as needed for anxiety (before weekly phlebotomy). (Patient not taking: Reported on 07/28/2023) 30 tablet 0   nicotine (NICODERM CQ - DOSED IN MG/24 HOURS) 14 mg/24hr patch Place 14 mg onto the skin daily. (Patient not taking: Reported on 07/28/2023)     oxyCODONE-acetaminophen (PERCOCET) 5-325 MG tablet Take 1 tablet by mouth every 4 (four) hours as needed. (Patient not taking: Reported on 06/11/2023) 15 tablet 0   valACYclovir (VALTREX) 1000 MG tablet Take  1 tablet (1,000 mg total) by mouth 2 (two) times daily. (Patient not taking: Reported on 07/28/2023) 10 tablet 2   No current facility-administered medications for this visit.    REVIEW OF SYSTEMS:   Constitutional: ( - ) fevers, ( - )  chills , ( - ) night sweats Eyes: ( - ) blurriness of vision, ( - ) double vision, ( - ) watery eyes Ears, nose, mouth, throat, and face: ( - ) mucositis, ( - ) sore throat Respiratory: ( - ) cough, ( - ) dyspnea, ( - ) wheezes Cardiovascular: ( - ) palpitation, ( - ) chest discomfort, ( - ) lower  extremity swelling Gastrointestinal:  ( - ) nausea, ( - ) heartburn, ( - ) change in bowel habits Skin: ( - ) abnormal skin rashes Lymphatics: ( - ) new lymphadenopathy, ( - ) easy bruising Neurological: ( - ) numbness, ( - ) tingling, ( - ) new weaknesses Behavioral/Psych: ( - ) mood change, ( - ) new changes  All other systems were reviewed with the patient and are negative.  PHYSICAL EXAMINATION: ECOG PERFORMANCE STATUS: 0 - Asymptomatic  Vitals:   07/28/23 1207  BP: (!) 149/100  Pulse: 81  Resp: 20  Temp: (!) 97.4 F (36.3 C)  SpO2: 100%    Filed Weights   07/28/23 1207  Weight: 219 lb 11.2 oz (99.7 kg)     GENERAL: well appearing male in NAD  SKIN: skin color, texture, turgor are normal, no rashes or significant lesions EYES: conjunctiva are pink and non-injected, sclera clear LUNGS: clear to auscultation and percussion with normal breathing effort HEART: regular rate & rhythm and no murmurs and no lower extremity edema Musculoskeletal: no cyanosis of digits and no clubbing  PSYCH: alert & oriented x 3, fluent speech NEURO: no focal motor/sensory deficits  LABORATORY DATA:  I have reviewed the data as listed    Latest Ref Rng & Units 07/28/2023   11:41 AM 07/19/2023    9:02 AM 07/12/2023    8:23 AM  CBC  WBC 4.0 - 10.5 K/uL 6.9  6.7  4.4   Hemoglobin 13.0 - 17.0 g/dL 16.1  09.6  04.5   Hematocrit 39.0 - 52.0 % 44.5  42.4  42.7   Platelets 150 - 400 K/uL 226  183  177        Latest Ref Rng & Units 07/28/2023   11:41 AM 07/19/2023    9:02 AM 07/12/2023    8:23 AM  CMP  Glucose 70 - 99 mg/dL 98  89  93   BUN 6 - 20 mg/dL 12  13  18    Creatinine 0.61 - 1.24 mg/dL 4.09  8.11  9.14   Sodium 135 - 145 mmol/L 138  139  138   Potassium 3.5 - 5.1 mmol/L 4.0  3.9  4.0   Chloride 98 - 111 mmol/L 106  107  108   CO2 22 - 32 mmol/L 25  25  23    Calcium 8.9 - 10.3 mg/dL 78.2  9.8  9.3   Total Protein 6.5 - 8.1 g/dL 7.6  6.8  6.6   Total Bilirubin 0.3 - 1.2 mg/dL 1.2   1.1  1.1   Alkaline Phos 38 - 126 U/L 60  61  60   AST 15 - 41 U/L 30  30  30    ALT 0 - 44 U/L 64  61  77     RADIOGRAPHIC STUDIES: I have personally reviewed  the radiological images as listed and agreed with the findings in the report. US BIOPSY (LIVER)  Result Date: 07/07/2023 CLINICAL DATA:  Elevated LFTs, hemochromatosis EXAM: ULTRASOUND-GUIDED CORE LIVER BIOPSY TECHNIQUE: An ultrasound guided liver biopsy was thoroughly discussed with the patient and questions were answered. The benefits, risks, alternatives, and complications were also discussed. The patient understands and wishes to proceed with the procedure. A verbal as well as written consent was obtained. Survey ultrasound of the liver was performed and an appropriate skin entry site was determined. Skin site was marked, prepped with Betadine, and draped in usual sterile fashion, and infiltrated locally with 1% lidocaine. Intravenous Fentanyl and Versed 4mg  were administered as conscious sedation during continuous monitoring of the patient's level of consciousness and physiological / cardiorespiratory status by the radiology RN, with a total moderate sedation time of 10 minutes. A 17 gauge trocar needle was advanced under ultrasound guidance into the liver. 2 solid-appearing coaxial 18gauge core samples were then obtained through the guide needle. The guide needle was removed. Post procedure scans demonstrate no apparent complication. COMPLICATIONS: COMPLICATIONS None immediate FINDINGS: Technically successful ultrasound-guided core random liver biopsy. IMPRESSION: 1. Technically successful ultrasound guided core liver biopsy. Electronically Signed   By: Corlis Leak M.D.   On: 07/07/2023 15:20    ASSESSMENT & PLAN Frank Huerta is a 42 y.o. male who presents for evaluation of hereditary hemochromatosis. Hereditary hemochromatosis is a hereditary condition caused by mutations in the HFE gene, which regulates iron absorption. The most  common genes mutated in this condition are the C282Y and H63D genes. Based on outside testing, Mr. Randazzo has evidence of homozygous mutation of C282Y gene.Homozygous mutations represent a disease state which requires phlebotomy to decrease ferritin levels to a goal of <50  (Blood (2010) 116 (3): 317-325). The goal is to decrease ferritin so there is no deposition in critical organs (liver, heart, pancreas and thyroid).   #Hereditary Hemochromatosis-homozygous mutation of C282Y gene --Started weekly phlebotomies on 06/14/2023 with goal ferritin <50.  --Outside US liver from 02/05/2023 showed hepatomegaly with fatty liver infiltration. No evidence of iron deposition.  --Echo showed no evidence of iron deposition. EF 65-60%. --Liver biopsy from 07/07/2023. 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  PLAN: --Labs today show WBC 6.9, Hgb 15.9, Plt 226. AST normal, ALT 64 (H). Ferritin levels pending today. Most recent ferritin from 07/19/2023 was 649 (originally 7,012). --Advised to stop alcohol and limit iron rich foods. Avoid any iron supplementation.  --Continue with weekly therapeutic phlebotomies. . --RTC in 8 weeks with labs and follow up with Dr. Candise Che   No orders of the defined types were placed in this encounter.   All questions were answered. The patient knows to call the clinic with any problems, questions or concerns.  I have spent a total of 25 minutes minutes of face-to-face and non-face-to-face time, preparing to see the patient, performing a medically appropriate examination, counseling and educating the patient, documenting clinical information in the electronic health record, independently interpreting results and communicating results to the patient, and care coordination.   Georga Kaufmann, PA-C Department of Hematology/Oncology [facility] at  [facility] Phone: [phone]

## 2023-08-03 DIAGNOSIS — F4322 Adjustment disorder with anxiety: Secondary | ICD-10-CM | POA: Diagnosis not present

## 2023-08-04 ENCOUNTER — Inpatient Hospital Stay: Payer: BC Managed Care – PPO

## 2023-08-04 DIAGNOSIS — F17290 Nicotine dependence, other tobacco product, uncomplicated: Secondary | ICD-10-CM | POA: Diagnosis not present

## 2023-08-04 LAB — CBC WITH DIFFERENTIAL (CANCER CENTER ONLY)
Abs Immature Granulocytes: 0.02 10*3/uL (ref 0.00–0.07)
Basophils Absolute: 0 10*3/uL (ref 0.0–0.1)
Basophils Relative: 0 %
Eosinophils Absolute: 0 10*3/uL (ref 0.0–0.5)
Eosinophils Relative: 0 %
HCT: 43.9 % (ref 39.0–52.0)
Hemoglobin: 15.3 g/dL (ref 13.0–17.0)
Immature Granulocytes: 0 %
Lymphocytes Relative: 34 %
Lymphs Abs: 2 10*3/uL (ref 0.7–4.0)
MCH: 33.7 pg (ref 26.0–34.0)
MCHC: 34.9 g/dL (ref 30.0–36.0)
MCV: 96.7 fL (ref 80.0–100.0)
Monocytes Absolute: 0.5 10*3/uL (ref 0.1–1.0)
Monocytes Relative: 8 %
Neutro Abs: 3.3 10*3/uL (ref 1.7–7.7)
Neutrophils Relative %: 58 %
Platelet Count: 208 10*3/uL (ref 150–400)
RBC: 4.54 MIL/uL (ref 4.22–5.81)
RDW: 12.8 % (ref 11.5–15.5)
WBC Count: 5.9 10*3/uL (ref 4.0–10.5)
nRBC: 0 % (ref 0.0–0.2)

## 2023-08-04 LAB — CMP (CANCER CENTER ONLY)
ALT: 29 U/L (ref 0–44)
AST: 16 U/L (ref 15–41)
Albumin: 4.6 g/dL (ref 3.5–5.0)
Alkaline Phosphatase: 51 U/L (ref 38–126)
Anion gap: 8 (ref 5–15)
BUN: 21 mg/dL — ABNORMAL HIGH (ref 6–20)
CO2: 23 mmol/L (ref 22–32)
Calcium: 10 mg/dL (ref 8.9–10.3)
Chloride: 107 mmol/L (ref 98–111)
Creatinine: 0.94 mg/dL (ref 0.61–1.24)
GFR, Estimated: >60 mL/min (ref >60)
Glucose, Bld: 92 mg/dL (ref 70–99)
Potassium: 3.7 mmol/L (ref 3.5–5.1)
Sodium: 138 mmol/L (ref 135–145)
Total Bilirubin: 1.7 mg/dL — ABNORMAL HIGH (ref 0.3–1.2)
Total Protein: 7.2 g/dL (ref 6.5–8.1)

## 2023-08-04 LAB — FERRITIN: Ferritin: 472 ng/mL — ABNORMAL HIGH (ref 24–336)

## 2023-08-04 NOTE — Patient Instructions (Signed)

## 2023-08-04 NOTE — Progress Notes (Signed)
Frank Huerta presents today for phlebotomy per MD orders. Pt requested to not be informed about anything regarding the process/procedure prior, during, or after d/t high anxiety.  Phlebotomy procedure started at 0958 and ended at 1002. 16 G phlebotomy kit to the L [facility] was used.  541 grams removed. Patient observed for 10 minutes after procedure without any incident. Patient tolerated procedure well. Patient declined all snacks and beverages offered.  IV needle removed intact. Ambulatory to lobby without complaint.

## 2023-08-10 ENCOUNTER — Inpatient Hospital Stay: Payer: BC Managed Care – PPO

## 2023-08-10 DIAGNOSIS — F17290 Nicotine dependence, other tobacco product, uncomplicated: Secondary | ICD-10-CM | POA: Diagnosis not present

## 2023-08-10 LAB — CBC WITH DIFFERENTIAL (CANCER CENTER ONLY)
Abs Immature Granulocytes: 0.02 10*3/uL (ref 0.00–0.07)
Basophils Absolute: 0 10*3/uL (ref 0.0–0.1)
Basophils Relative: 1 %
Eosinophils Absolute: 0.1 10*3/uL (ref 0.0–0.5)
Eosinophils Relative: 2 %
HCT: 43.5 % (ref 39.0–52.0)
Hemoglobin: 15 g/dL (ref 13.0–17.0)
Immature Granulocytes: 0 %
Lymphocytes Relative: 41 %
Lymphs Abs: 2.1 10*3/uL (ref 0.7–4.0)
MCH: 34.4 pg — ABNORMAL HIGH (ref 26.0–34.0)
MCHC: 34.5 g/dL (ref 30.0–36.0)
MCV: 99.8 fL (ref 80.0–100.0)
Monocytes Absolute: 0.4 10*3/uL (ref 0.1–1.0)
Monocytes Relative: 8 %
Neutro Abs: 2.4 10*3/uL (ref 1.7–7.7)
Neutrophils Relative %: 48 %
Platelet Count: 196 10*3/uL (ref 150–400)
RBC: 4.36 MIL/uL (ref 4.22–5.81)
RDW: 13.2 % (ref 11.5–15.5)
WBC Count: 5 10*3/uL (ref 4.0–10.5)
nRBC: 0 % (ref 0.0–0.2)

## 2023-08-10 LAB — CMP (CANCER CENTER ONLY)
ALT: 35 U/L (ref 0–44)
AST: 17 U/L (ref 15–41)
Albumin: 4.5 g/dL (ref 3.5–5.0)
Alkaline Phosphatase: 52 U/L (ref 38–126)
Anion gap: 6 (ref 5–15)
BUN: 16 mg/dL (ref 6–20)
CO2: 26 mmol/L (ref 22–32)
Calcium: 9.6 mg/dL (ref 8.9–10.3)
Chloride: 107 mmol/L (ref 98–111)
Creatinine: 1.11 mg/dL (ref 0.61–1.24)
GFR, Estimated: >60 mL/min (ref >60)
Glucose, Bld: 69 mg/dL — ABNORMAL LOW (ref 70–99)
Potassium: 3.6 mmol/L (ref 3.5–5.1)
Sodium: 139 mmol/L (ref 135–145)
Total Bilirubin: 0.7 mg/dL (ref 0.3–1.2)
Total Protein: 6.8 g/dL (ref 6.5–8.1)

## 2023-08-10 LAB — FERRITIN: Ferritin: 280 ng/mL (ref 24–336)

## 2023-08-10 NOTE — Patient Instructions (Signed)

## 2023-08-10 NOTE — Progress Notes (Signed)
Frank Huerta presents today for phlebotomy per MD orders. Pt requested to not be informed about anything regarding the process prior, during, or after d/t anxiety. Phlebotomy procedure started at 0917 and ended at 0923. 16 gauge phlebotomy kit used to the R AC. 531 grams removed. Patient observed for 10 minutes after procedure without any incident. Patient tolerated procedure well. Pt declined snack and beverage. IV needle removed intact. VSS at discharge, pt discharged to lobby without complaint.

## 2023-08-12 DIAGNOSIS — F900 Attention-deficit hyperactivity disorder, predominantly inattentive type: Secondary | ICD-10-CM | POA: Diagnosis not present

## 2023-08-17 ENCOUNTER — Inpatient Hospital Stay: Payer: BC Managed Care – PPO

## 2023-08-17 DIAGNOSIS — F17290 Nicotine dependence, other tobacco product, uncomplicated: Secondary | ICD-10-CM | POA: Diagnosis not present

## 2023-08-17 LAB — CBC WITH DIFFERENTIAL (CANCER CENTER ONLY)
Abs Immature Granulocytes: 0.01 10*3/uL (ref 0.00–0.07)
Basophils Absolute: 0 10*3/uL (ref 0.0–0.1)
Basophils Relative: 1 %
Eosinophils Absolute: 0.1 10*3/uL (ref 0.0–0.5)
Eosinophils Relative: 1 %
HCT: 43.9 % (ref 39.0–52.0)
Hemoglobin: 15.4 g/dL (ref 13.0–17.0)
Immature Granulocytes: 0 %
Lymphocytes Relative: 46 %
Lymphs Abs: 2.3 10*3/uL (ref 0.7–4.0)
MCH: 34.8 pg — ABNORMAL HIGH (ref 26.0–34.0)
MCHC: 35.1 g/dL (ref 30.0–36.0)
MCV: 99.1 fL (ref 80.0–100.0)
Monocytes Absolute: 0.2 10*3/uL (ref 0.1–1.0)
Monocytes Relative: 5 %
Neutro Abs: 2.3 10*3/uL (ref 1.7–7.7)
Neutrophils Relative %: 47 %
Platelet Count: 193 10*3/uL (ref 150–400)
RBC: 4.43 MIL/uL (ref 4.22–5.81)
RDW: 12.9 % (ref 11.5–15.5)
WBC Count: 5 10*3/uL (ref 4.0–10.5)
nRBC: 0 % (ref 0.0–0.2)

## 2023-08-17 LAB — CMP (CANCER CENTER ONLY)
ALT: 34 U/L (ref 0–44)
AST: 19 U/L (ref 15–41)
Albumin: 4.5 g/dL (ref 3.5–5.0)
Alkaline Phosphatase: 54 U/L (ref 38–126)
Anion gap: 5 (ref 5–15)
BUN: 19 mg/dL (ref 6–20)
CO2: 28 mmol/L (ref 22–32)
Calcium: 9.7 mg/dL (ref 8.9–10.3)
Chloride: 104 mmol/L (ref 98–111)
Creatinine: 1.17 mg/dL (ref 0.61–1.24)
GFR, Estimated: >60 mL/min (ref >60)
Glucose, Bld: 115 mg/dL — ABNORMAL HIGH (ref 70–99)
Potassium: 3.8 mmol/L (ref 3.5–5.1)
Sodium: 137 mmol/L (ref 135–145)
Total Bilirubin: 1.1 mg/dL (ref 0.3–1.2)
Total Protein: 6.8 g/dL (ref 6.5–8.1)

## 2023-08-17 LAB — FERRITIN: Ferritin: 221 ng/mL (ref 24–336)

## 2023-08-17 NOTE — Patient Instructions (Signed)

## 2023-08-17 NOTE — Progress Notes (Signed)
Frank Huerta presents today for phlebotomy per MD orders. Pt requests to not be told about when he is about to be stuck with needle or updated on how the procedure is going. Phlebotomy procedure started at 0906 w/18 G Blood set and ended at 0910. 517 grams removed. Patient observed for 10 minutes after procedure without any incident and requests to be discharged. Patient tolerated procedure well. IV needle removed intact.

## 2023-08-23 ENCOUNTER — Other Ambulatory Visit: Payer: Self-pay

## 2023-08-24 ENCOUNTER — Inpatient Hospital Stay: Payer: BC Managed Care – PPO

## 2023-08-24 DIAGNOSIS — F17290 Nicotine dependence, other tobacco product, uncomplicated: Secondary | ICD-10-CM | POA: Diagnosis not present

## 2023-08-24 LAB — CBC WITH DIFFERENTIAL (CANCER CENTER ONLY)
Abs Immature Granulocytes: 0.01 10*3/uL (ref 0.00–0.07)
Basophils Absolute: 0 10*3/uL (ref 0.0–0.1)
Basophils Relative: 1 %
Eosinophils Absolute: 0.1 10*3/uL (ref 0.0–0.5)
Eosinophils Relative: 2 %
HCT: 43.2 % (ref 39.0–52.0)
Hemoglobin: 15.1 g/dL (ref 13.0–17.0)
Immature Granulocytes: 0 %
Lymphocytes Relative: 46 %
Lymphs Abs: 2.2 10*3/uL (ref 0.7–4.0)
MCH: 34.5 pg — ABNORMAL HIGH (ref 26.0–34.0)
MCHC: 35 g/dL (ref 30.0–36.0)
MCV: 98.6 fL (ref 80.0–100.0)
Monocytes Absolute: 0.3 10*3/uL (ref 0.1–1.0)
Monocytes Relative: 7 %
Neutro Abs: 2.1 10*3/uL (ref 1.7–7.7)
Neutrophils Relative %: 44 %
Platelet Count: 172 10*3/uL (ref 150–400)
RBC: 4.38 MIL/uL (ref 4.22–5.81)
RDW: 13 % (ref 11.5–15.5)
WBC Count: 4.8 10*3/uL (ref 4.0–10.5)
nRBC: 0 % (ref 0.0–0.2)

## 2023-08-24 LAB — CMP (CANCER CENTER ONLY)
ALT: 37 U/L (ref 0–44)
AST: 22 U/L (ref 15–41)
Albumin: 4.4 g/dL (ref 3.5–5.0)
Alkaline Phosphatase: 50 U/L (ref 38–126)
Anion gap: 6 (ref 5–15)
BUN: 17 mg/dL (ref 6–20)
CO2: 24 mmol/L (ref 22–32)
Calcium: 9.6 mg/dL (ref 8.9–10.3)
Chloride: 109 mmol/L (ref 98–111)
Creatinine: 1.05 mg/dL (ref 0.61–1.24)
GFR, Estimated: >60 mL/min (ref >60)
Glucose, Bld: 85 mg/dL (ref 70–99)
Potassium: 3.8 mmol/L (ref 3.5–5.1)
Sodium: 139 mmol/L (ref 135–145)
Total Bilirubin: 0.6 mg/dL (ref 0.3–1.2)
Total Protein: 6.7 g/dL (ref 6.5–8.1)

## 2023-08-24 LAB — FERRITIN: Ferritin: 197 ng/mL (ref 24–336)

## 2023-08-24 NOTE — Patient Instructions (Signed)

## 2023-08-24 NOTE — Progress Notes (Signed)
Frank Huerta presents today for phlebotomy per MD orders. Phlebotomy procedure started at 0908 and ended at 0915. Phlebotomy kit used 525 grams removed. Patient observed for 15 minutes after procedure without any incident. Patient tolerated procedure well. IV needle removed intact. Food and fluids offered, fluids taken. Pt. declined to stay for full 30 minute post observation. Vital signs stable, left via ambulation and no respiratory distress noted.

## 2023-08-25 LAB — TESTOSTERONE: Testosterone: 309 ng/dL (ref 264–916)

## 2023-09-01 ENCOUNTER — Inpatient Hospital Stay: Payer: BC Managed Care – PPO | Attending: Physician Assistant

## 2023-09-01 ENCOUNTER — Inpatient Hospital Stay: Payer: BC Managed Care – PPO

## 2023-09-01 LAB — CBC WITH DIFFERENTIAL (CANCER CENTER ONLY)
Abs Immature Granulocytes: 0.01 10*3/uL (ref 0.00–0.07)
Basophils Absolute: 0 10*3/uL (ref 0.0–0.1)
Basophils Relative: 1 %
Eosinophils Absolute: 0.1 10*3/uL (ref 0.0–0.5)
Eosinophils Relative: 1 %
HCT: 43.4 % (ref 39.0–52.0)
Hemoglobin: 15.9 g/dL (ref 13.0–17.0)
Immature Granulocytes: 0 %
Lymphocytes Relative: 41 %
Lymphs Abs: 1.9 10*3/uL (ref 0.7–4.0)
MCH: 34.9 pg — ABNORMAL HIGH (ref 26.0–34.0)
MCHC: 36.6 g/dL — ABNORMAL HIGH (ref 30.0–36.0)
MCV: 95.4 fL (ref 80.0–100.0)
Monocytes Absolute: 0.4 10*3/uL (ref 0.1–1.0)
Monocytes Relative: 8 %
Neutro Abs: 2.2 10*3/uL (ref 1.7–7.7)
Neutrophils Relative %: 49 %
Platelet Count: 186 10*3/uL (ref 150–400)
RBC: 4.55 MIL/uL (ref 4.22–5.81)
RDW: 12.5 % (ref 11.5–15.5)
WBC Count: 4.5 10*3/uL (ref 4.0–10.5)
nRBC: 0 % (ref 0.0–0.2)

## 2023-09-01 LAB — CMP (CANCER CENTER ONLY)
ALT: 35 U/L (ref 0–44)
AST: 22 U/L (ref 15–41)
Albumin: 4.6 g/dL (ref 3.5–5.0)
Alkaline Phosphatase: 49 U/L (ref 38–126)
Anion gap: 7 (ref 5–15)
BUN: 13 mg/dL (ref 6–20)
CO2: 25 mmol/L (ref 22–32)
Calcium: 9.8 mg/dL (ref 8.9–10.3)
Chloride: 106 mmol/L (ref 98–111)
Creatinine: 1 mg/dL (ref 0.61–1.24)
GFR, Estimated: >60 mL/min (ref >60)
Glucose, Bld: 110 mg/dL — ABNORMAL HIGH (ref 70–99)
Potassium: 3.7 mmol/L (ref 3.5–5.1)
Sodium: 138 mmol/L (ref 135–145)
Total Bilirubin: 1.4 mg/dL — ABNORMAL HIGH (ref <1.2)
Total Protein: 7 g/dL (ref 6.5–8.1)

## 2023-09-01 LAB — FERRITIN: Ferritin: 192 ng/mL (ref 24–336)

## 2023-09-01 NOTE — Patient Instructions (Signed)

## 2023-09-01 NOTE — Progress Notes (Signed)
Frank Huerta presents today for phlebotomy per MD orders. Phlebotomy procedure started at 1018 and ended at 1022. 16 G Phlebotomy kit used to the R AC.  534 grams removed. Patient observed for 10 minutes after procedure without any incident.  Pt provided PB crackers post procedure.  Patient tolerated procedure well. IV needle removed intact. Pt ambulatory to lobby at discharge without complaint. VSS at discharge.

## 2023-09-03 DIAGNOSIS — F4322 Adjustment disorder with anxiety: Secondary | ICD-10-CM | POA: Diagnosis not present

## 2023-09-08 ENCOUNTER — Other Ambulatory Visit: Payer: No Typology Code available for payment source

## 2023-09-08 ENCOUNTER — Inpatient Hospital Stay: Payer: BC Managed Care – PPO

## 2023-09-08 ENCOUNTER — Ambulatory Visit: Payer: No Typology Code available for payment source | Admitting: Hematology

## 2023-09-08 LAB — CMP (CANCER CENTER ONLY)
ALT: 33 U/L (ref 0–44)
AST: 21 U/L (ref 15–41)
Albumin: 4.3 g/dL (ref 3.5–5.0)
Alkaline Phosphatase: 46 U/L (ref 38–126)
Anion gap: 7 (ref 5–15)
BUN: 14 mg/dL (ref 6–20)
CO2: 24 mmol/L (ref 22–32)
Calcium: 9.3 mg/dL (ref 8.9–10.3)
Chloride: 106 mmol/L (ref 98–111)
Creatinine: 0.86 mg/dL (ref 0.61–1.24)
GFR, Estimated: >60 mL/min (ref >60)
Glucose, Bld: 100 mg/dL — ABNORMAL HIGH (ref 70–99)
Potassium: 3.4 mmol/L — ABNORMAL LOW (ref 3.5–5.1)
Sodium: 137 mmol/L (ref 135–145)
Total Bilirubin: 1.2 mg/dL — ABNORMAL HIGH (ref <1.2)
Total Protein: 6.5 g/dL (ref 6.5–8.1)

## 2023-09-08 LAB — CBC WITH DIFFERENTIAL (CANCER CENTER ONLY)
Abs Immature Granulocytes: 0.01 10*3/uL (ref 0.00–0.07)
Basophils Absolute: 0 10*3/uL (ref 0.0–0.1)
Basophils Relative: 1 %
Eosinophils Absolute: 0.1 10*3/uL (ref 0.0–0.5)
Eosinophils Relative: 2 %
HCT: 40.9 % (ref 39.0–52.0)
Hemoglobin: 14.7 g/dL (ref 13.0–17.0)
Immature Granulocytes: 0 %
Lymphocytes Relative: 45 %
Lymphs Abs: 1.9 10*3/uL (ref 0.7–4.0)
MCH: 34.6 pg — ABNORMAL HIGH (ref 26.0–34.0)
MCHC: 35.9 g/dL (ref 30.0–36.0)
MCV: 96.2 fL (ref 80.0–100.0)
Monocytes Absolute: 0.3 10*3/uL (ref 0.1–1.0)
Monocytes Relative: 8 %
Neutro Abs: 1.8 10*3/uL (ref 1.7–7.7)
Neutrophils Relative %: 44 %
Platelet Count: 172 10*3/uL (ref 150–400)
RBC: 4.25 MIL/uL (ref 4.22–5.81)
RDW: 12.3 % (ref 11.5–15.5)
WBC Count: 4.1 10*3/uL (ref 4.0–10.5)
nRBC: 0 % (ref 0.0–0.2)

## 2023-09-08 LAB — FERRITIN: Ferritin: 164 ng/mL (ref 24–336)

## 2023-09-08 NOTE — Patient Instructions (Signed)

## 2023-09-08 NOTE — Progress Notes (Signed)
Frank Huerta presents today for phlebotomy per MD orders. Phlebotomy procedure started at 1035 and ended at 1045. Procedure performed by Ricky Ala RN. 16 G phlebotomy kit to the L [facility] utilized for procedure.  503 grams removed. Patient observed for 15 minutes after procedure without any incident. Patient tolerated procedure well. IV needle removed intact. VSS at discharge.

## 2023-09-13 ENCOUNTER — Other Ambulatory Visit: Payer: Self-pay | Admitting: Gastroenterology

## 2023-09-13 DIAGNOSIS — R140 Abdominal distension (gaseous): Secondary | ICD-10-CM | POA: Diagnosis not present

## 2023-09-13 DIAGNOSIS — R945 Abnormal results of liver function studies: Secondary | ICD-10-CM | POA: Diagnosis not present

## 2023-09-15 ENCOUNTER — Inpatient Hospital Stay: Admission: RE | Admit: 2023-09-15 | Payer: BC Managed Care – PPO | Source: Ambulatory Visit

## 2023-09-15 ENCOUNTER — Encounter: Payer: Self-pay | Admitting: Physician Assistant

## 2023-09-16 ENCOUNTER — Other Ambulatory Visit: Payer: Self-pay | Admitting: Gastroenterology

## 2023-09-16 ENCOUNTER — Encounter (HOSPITAL_BASED_OUTPATIENT_CLINIC_OR_DEPARTMENT_OTHER): Payer: Self-pay

## 2023-09-16 ENCOUNTER — Ambulatory Visit (HOSPITAL_BASED_OUTPATIENT_CLINIC_OR_DEPARTMENT_OTHER): Admit: 2023-09-16 | Payer: BC Managed Care – PPO | Admitting: Orthopaedic Surgery

## 2023-09-16 DIAGNOSIS — R140 Abdominal distension (gaseous): Secondary | ICD-10-CM

## 2023-09-16 SURGERY — ARTHROSCOPY SHOULDER
Anesthesia: General | Site: Shoulder | Laterality: Left

## 2023-09-17 ENCOUNTER — Ambulatory Visit
Admission: RE | Admit: 2023-09-17 | Discharge: 2023-09-17 | Disposition: A | Discharge status: Home / Self Care | Payer: BC Managed Care – PPO | Source: Ambulatory Visit | Attending: Gastroenterology | Admitting: Gastroenterology

## 2023-09-17 DIAGNOSIS — R140 Abdominal distension (gaseous): Secondary | ICD-10-CM | POA: Diagnosis not present

## 2023-09-21 ENCOUNTER — Inpatient Hospital Stay: Payer: BC Managed Care – PPO

## 2023-09-21 LAB — CBC WITH DIFFERENTIAL (CANCER CENTER ONLY)
Abs Immature Granulocytes: 0.01 10*3/uL (ref 0.00–0.07)
Basophils Absolute: 0 10*3/uL (ref 0.0–0.1)
Basophils Relative: 1 %
Eosinophils Absolute: 0.1 10*3/uL (ref 0.0–0.5)
Eosinophils Relative: 1 %
HCT: 45.4 % (ref 39.0–52.0)
Hemoglobin: 15.8 g/dL (ref 13.0–17.0)
Immature Granulocytes: 0 %
Lymphocytes Relative: 26 %
Lymphs Abs: 1.5 10*3/uL (ref 0.7–4.0)
MCH: 34.3 pg — ABNORMAL HIGH (ref 26.0–34.0)
MCHC: 34.8 g/dL (ref 30.0–36.0)
MCV: 98.7 fL (ref 80.0–100.0)
Monocytes Absolute: 0.5 10*3/uL (ref 0.1–1.0)
Monocytes Relative: 9 %
Neutro Abs: 3.7 10*3/uL (ref 1.7–7.7)
Neutrophils Relative %: 63 %
Platelet Count: 187 10*3/uL (ref 150–400)
RBC: 4.6 MIL/uL (ref 4.22–5.81)
RDW: 12.3 % (ref 11.5–15.5)
WBC Count: 5.8 10*3/uL (ref 4.0–10.5)
nRBC: 0 % (ref 0.0–0.2)

## 2023-09-21 LAB — CMP (CANCER CENTER ONLY)
ALT: 38 U/L (ref 0–44)
AST: 23 U/L (ref 15–41)
Albumin: 4.6 g/dL (ref 3.5–5.0)
Alkaline Phosphatase: 43 U/L (ref 38–126)
Anion gap: 8 (ref 5–15)
BUN: 15 mg/dL (ref 6–20)
CO2: 24 mmol/L (ref 22–32)
Calcium: 9.8 mg/dL (ref 8.9–10.3)
Chloride: 106 mmol/L (ref 98–111)
Creatinine: 1.08 mg/dL (ref 0.61–1.24)
GFR, Estimated: >60 mL/min (ref >60)
Glucose, Bld: 141 mg/dL — ABNORMAL HIGH (ref 70–99)
Potassium: 3.7 mmol/L (ref 3.5–5.1)
Sodium: 138 mmol/L (ref 135–145)
Total Bilirubin: 1.3 mg/dL — ABNORMAL HIGH (ref <1.2)
Total Protein: 7 g/dL (ref 6.5–8.1)

## 2023-09-21 LAB — FERRITIN: Ferritin: 102 ng/mL (ref 24–336)

## 2023-09-21 NOTE — Progress Notes (Signed)
Frank Huerta presents today for phlebotomy per MD orders. Phlebotomy procedure started at 0838 and ended at 62. 47 G phlebotomy kit to the R [facility] was utilized for the procedure.  534 grams removed. Patient observed for 15 minutes after procedure without any incident. Patient tolerated procedure well. Pt provided PB crackers post procedure. IV needle removed intact. VSS at discharge. Pt ambulatory in stable condition to lobby at the time of discharge.

## 2023-09-21 NOTE — Patient Instructions (Signed)

## 2023-09-28 ENCOUNTER — Other Ambulatory Visit: Payer: Self-pay

## 2023-09-29 ENCOUNTER — Inpatient Hospital Stay: Payer: BC Managed Care – PPO

## 2023-09-29 ENCOUNTER — Inpatient Hospital Stay: Payer: BC Managed Care – PPO | Attending: Physician Assistant | Admitting: Hematology

## 2023-09-29 DIAGNOSIS — Z79624 Long term (current) use of inhibitors of nucleotide synthesis: Secondary | ICD-10-CM | POA: Diagnosis not present

## 2023-09-29 DIAGNOSIS — F17290 Nicotine dependence, other tobacco product, uncomplicated: Secondary | ICD-10-CM | POA: Diagnosis not present

## 2023-09-29 LAB — CBC WITH DIFFERENTIAL (CANCER CENTER ONLY)
Abs Immature Granulocytes: 0.01 10*3/uL (ref 0.00–0.07)
Basophils Absolute: 0 10*3/uL (ref 0.0–0.1)
Basophils Relative: 1 %
Eosinophils Absolute: 0.1 10*3/uL (ref 0.0–0.5)
Eosinophils Relative: 2 %
HCT: 42.8 % (ref 39.0–52.0)
Hemoglobin: 14.9 g/dL (ref 13.0–17.0)
Immature Granulocytes: 0 %
Lymphocytes Relative: 41 %
Lymphs Abs: 1.6 10*3/uL (ref 0.7–4.0)
MCH: 34 pg (ref 26.0–34.0)
MCHC: 34.8 g/dL (ref 30.0–36.0)
MCV: 97.7 fL (ref 80.0–100.0)
Monocytes Absolute: 0.3 10*3/uL (ref 0.1–1.0)
Monocytes Relative: 8 %
Neutro Abs: 1.9 10*3/uL (ref 1.7–7.7)
Neutrophils Relative %: 48 %
Platelet Count: 221 10*3/uL (ref 150–400)
RBC: 4.38 MIL/uL (ref 4.22–5.81)
RDW: 11.9 % (ref 11.5–15.5)
WBC Count: 3.9 10*3/uL — ABNORMAL LOW (ref 4.0–10.5)
nRBC: 0 % (ref 0.0–0.2)

## 2023-09-29 LAB — CMP (CANCER CENTER ONLY)
ALT: 24 U/L (ref 0–44)
AST: 19 U/L (ref 15–41)
Albumin: 4.2 g/dL (ref 3.5–5.0)
Alkaline Phosphatase: 44 U/L (ref 38–126)
Anion gap: 6 (ref 5–15)
BUN: 12 mg/dL (ref 6–20)
CO2: 27 mmol/L (ref 22–32)
Calcium: 9.7 mg/dL (ref 8.9–10.3)
Chloride: 106 mmol/L (ref 98–111)
Creatinine: 0.96 mg/dL (ref 0.61–1.24)
GFR, Estimated: >60 mL/min (ref >60)
Glucose, Bld: 106 mg/dL — ABNORMAL HIGH (ref 70–99)
Potassium: 3.6 mmol/L (ref 3.5–5.1)
Sodium: 139 mmol/L (ref 135–145)
Total Bilirubin: 0.8 mg/dL (ref <1.2)
Total Protein: 6.5 g/dL (ref 6.5–8.1)

## 2023-09-29 LAB — FERRITIN: Ferritin: 98 ng/mL (ref 24–336)

## 2023-09-29 MED ORDER — LORAZEPAM 0.5 MG PO TABS: 0.5000 mg | ORAL_TABLET | ORAL | 0 refills | Status: DC | PRN

## 2023-10-05 ENCOUNTER — Encounter: Payer: Self-pay | Admitting: Physician Assistant

## 2023-10-05 NOTE — Progress Notes (Signed)
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   HISTORY OF PRESENTING ILLNESS:  LYRIQ MARCIA 42 y.o. male returns for a follow up for hereditary hemochromatosis.   .Discussed the use of AI scribe software for clinical note transcription with the patient, who gave verbal consent to proceed.  Mr. Trower, a patient with hemochromatosis, presents for a follow-up visit after 14 weeks of phlebotomy treatment. The patient reports significant improvement in his condition, with ferritin levels dropping from nearly 7000 to 104. He expresses a desire to take a break from the weekly phlebotomies, suggesting a shift to a bi-weekly schedule.  The patient also reports a recent decision to forego a planned shoulder surgery due to a lack of pain and good range of motion. He speculates that his shoulder tightness might be related to iron deposits in the cartilage, a common symptom of hemochromatosis.  In the period between his last two phlebotomies, the patient admits to consuming alcohol, red meat, and seafood, yet his ferritin levels continued to drop. He expresses surprise at this outcome, expecting his levels to rise due to his dietary choices.  Lastly, the patient discusses concerns about low energy levels and a recent testosterone test that showed levels at the lower end of the normal range. He expresses interest in hormone therapy, mentioning a previous six-month course of testosterone that made him feel significantly better. However, he also expresses willingness to consider alternative approaches to improve his energy levels and overall health.      MEDICAL HISTORY:  Past Medical History:   Diagnosis Date   Attention deficit disorder (ADD)    Cocaine abuse (HCC)    GAD (generalized anxiety disorder)    Hemorrhoids    History of condyloma acuminatum 11/2003   urethral    SURGICAL HISTORY: Past Surgical History:  Procedure Laterality Date   URETHROPLASTY  12/12/2003   @[facility]  by dr s. Patsi Sears;   meatotomy,  excision wart distal urethral, cystoscopy, and urethroplasty    SOCIAL HISTORY: Social History   Socioeconomic History   Marital status: Single    Spouse name: Not on file   Number of children: Not on file   Years of education: Not on file   Highest education level: Not on file  Occupational History   Not on file  Tobacco Use   Smoking status: Every Day    Types: E-cigarettes   Smokeless tobacco: Never  Vaping Use   Vaping status: Every Day  Substance and Sexual Activity   Alcohol use: Yes    Comment: three times weekly   Drug use: Yes    Types: Cocaine   Sexual activity: Not on file  Other Topics Concern   Not on file  Social History Narrative   Not on file   Social Determinants of Health   Financial Resource Strain: Not on file  Food Insecurity: Not on file  Transportation Needs: Not on file  Physical Activity: Not on file  Stress: Not on file  Social Connections: Not on file  Intimate Partner Violence: Not on file    FAMILY HISTORY: No family history on file.  ALLERGIES:  has No Known Allergies.  MEDICATIONS:  Current Outpatient Medications  Medication Sig Dispense Refill   amphetamine-dextroamphetamine (ADDERALL) 10 MG  tablet Take 5 mg by mouth daily as needed.     nicotine (NICODERM CQ - DOSED IN MG/24 HOURS) 14 mg/24hr patch Place 14 mg onto the skin daily.     Peppermint Oil (IBGARD) 90 MG CPCR 2 capsules Orally once a day     valACYclovir (VALTREX) 1000 MG tablet Take 1 tablet (1,000 mg total) by mouth 2 (two) times daily. 10 tablet 2   LORazepam (ATIVAN) 0.5 MG tablet Take 1 tablet (0.5 mg total) by mouth as needed for  anxiety (before weekly phlebotomy). 30 tablet 0   oxyCODONE-acetaminophen (PERCOCET) 5-325 MG tablet Take 1 tablet by mouth every 4 (four) hours as needed. (Patient not taking: Reported on 06/11/2023) 15 tablet 0   No current facility-administered medications for this visit.    REVIEW OF SYSTEMS:   .10 Point review of Systems was done is negative except as noted above.   PHYSICAL EXAMINATION: ECOG PERFORMANCE STATUS: 0 - Asymptomatic  Vitals:   09/29/23 0940  BP: (!) 155/99  Pulse: 74  Resp: 20  Temp: 97.9 F (36.6 C)  SpO2: 98%    Filed Weights   09/29/23 0940  Weight: 214 lb 12.8 oz (97.4 kg)    GENERAL:alert, in no acute distress and comfortable SKIN: no acute rashes, no significant lesions EYES: conjunctiva are pink and non-injected, sclera anicteric OROPHARYNX: MMM, no exudates, no oropharyngeal erythema or ulceration NECK: supple, no JVD LYMPH:  no palpable lymphadenopathy in the cervical, axillary or inguinal regions LUNGS: clear to auscultation b/l with normal respiratory effort HEART: regular rate & rhythm ABDOMEN:  normoactive bowel sounds , non tender, not distended. Extremity: no pedal edema PSYCH: alert & oriented x 3 with fluent speech NEURO: no focal motor/sensory deficits    LABORATORY DATA:  I have reviewed the data as listed    Latest Ref Rng & Units 09/29/2023    8:54 AM 09/21/2023    8:02 AM 09/08/2023    9:34 AM  CBC  WBC 4.0 - 10.5 K/uL 3.9  5.8  4.1   Hemoglobin 13.0 - 17.0 g/dL 16.1  09.6  04.5   Hematocrit 39.0 - 52.0 % 42.8  45.4  40.9   Platelets 150 - 400 K/uL 221  187  172        Latest Ref Rng & Units 09/29/2023    8:54 AM 09/21/2023    8:02 AM 09/08/2023    9:34 AM  CMP  Glucose 70 - 99 mg/dL 409  811  914   BUN 6 - 20 mg/dL 12  15  14    Creatinine 0.61 - 1.24 mg/dL 7.82  9.56  2.13   Sodium 135 - 145 mmol/L 139  138  137   Potassium 3.5 - 5.1 mmol/L 3.6  3.7  3.4   Chloride 98 - 111 mmol/L 106  106  106   CO2 22 - 32  mmol/L 27  24  24    Calcium 8.9 - 10.3 mg/dL 9.7  9.8  9.3   Total Protein 6.5 - 8.1 g/dL 6.5  7.0  6.5   Total Bilirubin <1.2 mg/dL 0.8  1.3  1.2   Alkaline Phos 38 - 126 U/L 44  43  46   AST 15 - 41 U/L 19  23  21    ALT 0 - 44 U/L 24  38  33   . Lab Results  Component Value Date   IRON 273 (H) 06/11/2023   TIBC 274 06/11/2023   IRONPCTSAT 100 (H)  06/11/2023   (Iron and TIBC)  Lab Results  Component Value Date   FERRITIN 98 09/29/2023      RADIOGRAPHIC STUDIES: I have personally reviewed the radiological images as listed and agreed with the findings in the report. US Abdomen Complete  Result Date: 09/18/2023 CLINICAL DATA:  Abdominal distention EXAM: ABDOMEN ULTRASOUND COMPLETE COMPARISON:  None Available. FINDINGS: Gallbladder: No gallstones or wall thickening visualized. No sonographic Murphy sign noted by sonographer. Common bile duct: Diameter: 3 mm Liver: Increased echogenicity. No focal lesion. Portal vein is patent on color Doppler imaging with normal direction of blood flow towards the liver. IVC: No abnormality visualized. Pancreas: Visualized portion unremarkable. Spleen: Size and appearance within normal limits. Right Kidney: Length: 12.2 cm. Echogenicity within normal limits. No mass or hydronephrosis visualized. Left Kidney: Length: 12.5 cm. Echogenicity within normal limits. No mass or hydronephrosis visualized. Abdominal aorta: No aneurysm visualized. Other findings: None. IMPRESSION: 1. Increased hepatic parenchymal echogenicity suggestive of steatosis. 2. No cholelithiasis or sonographic evidence for acute cholecystitis. Electronically Signed   By: Annia Belt M.D.   On: 09/18/2023 14:26    ASSESSMENT & PLAN DEYVI FICCO is a 42 y.o. male who presents for evaluation of hereditary hemochromatosis. Hereditary hemochromatosis is a hereditary condition caused by mutations in the HFE gene, which regulates iron absorption. The most common genes mutated in this condition are  the C282Y and H63D genes. Based on outside testing, Mr. Lafleur has evidence of homozygous mutation of C282Y gene.Homozygous mutations represent a disease state which requires phlebotomy to decrease ferritin levels to a goal of <50  (Blood (2010) 116 (3): 317-325). The goal is to decrease ferritin so there is no deposition in critical organs (liver, heart, pancreas and thyroid).   #Hereditary Hemochromatosis-homozygous mutation of C282Y gene --Started weekly phlebotomies on 06/14/2023 with goal ferritin <50.  --Outside US liver from 02/05/2023 showed hepatomegaly with fatty liver infiltration. No evidence of iron deposition.  --Echo showed no evidence of iron deposition. EF 65-60%. --Liver biopsy from 07/07/2023. 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     Hemochromatosis Ferritin levels have significantly decreased from 7000 to 102 after 14 weeks of weekly phlebotomies. Liver function tests have normalized. Patient has ceased alcohol consumption. -Continue phlebotomies every two weeks starting after new years per patients request. -Check ferritin levels regularly to monitor progress.  Low Testosterone Patient reports low energy and has a testosterone level of 309, which is on the lower end of the normal range. Patient has previously used testosterone replacement therapy and is considering it again. -Advise patient to continue with lifestyle modifications including exercise and maintaining a healthy diet. -Reevaluate testosterone levels in six months, after iron levels have stabilized, before considering testosterone replacement therapy.  Shoulder Pain Patient reports improvement in shoulder pain and has been advised to continue with physical activity. -Encourage continued physical activity and exercise.  Alcohol Consumption Patient has ceased alcohol consumption which  has contributed to improvement in liver function tests. -Encourage continued abstinence from alcohol.  General Health Maintenance -Advise patient to check the iron content of any pre-workout supplements. -Consider potential effects of caffeine and other ingredients in pre-workout supplements on overall health.     FOLLOWUP Change therpaeutic phlebotomies to every 2 weeks with labs from 1st week of Jan 2025 MD visit with Dr Candise Che in 3 months with labs  .The total time spent in  the appointment was 30 minutes* .  All of the patient's questions were answered with apparent satisfaction. The patient knows to call the clinic with any problems, questions or concerns.   Wyvonnia Lora MD MS AAHIVMS [facility] Hematology/Oncology Physician [facility]  .*Total Encounter Time as defined by the Centers for Medicare and Medicaid Services includes, in addition to the face-to-face time of a patient visit (documented in the note above) non-face-to-face time: obtaining and reviewing outside history, ordering and reviewing medications, tests or procedures, care coordination (communications with other health care professionals or caregivers) and documentation in the medical record.

## 2023-10-06 DIAGNOSIS — F4322 Adjustment disorder with anxiety: Secondary | ICD-10-CM | POA: Diagnosis not present

## 2023-10-08 DIAGNOSIS — L309 Dermatitis, unspecified: Secondary | ICD-10-CM | POA: Diagnosis not present

## 2023-10-11 DIAGNOSIS — F4322 Adjustment disorder with anxiety: Secondary | ICD-10-CM | POA: Diagnosis not present

## 2023-10-26 DIAGNOSIS — M6281 Muscle weakness (generalized): Secondary | ICD-10-CM | POA: Diagnosis not present

## 2023-10-26 DIAGNOSIS — M7502 Adhesive capsulitis of left shoulder: Secondary | ICD-10-CM | POA: Diagnosis not present

## 2023-11-01 ENCOUNTER — Other Ambulatory Visit: Payer: Self-pay

## 2023-11-01 DIAGNOSIS — M7502 Adhesive capsulitis of left shoulder: Secondary | ICD-10-CM | POA: Diagnosis not present

## 2023-11-01 DIAGNOSIS — M6281 Muscle weakness (generalized): Secondary | ICD-10-CM | POA: Diagnosis not present

## 2023-11-02 ENCOUNTER — Inpatient Hospital Stay: Payer: BC Managed Care – PPO

## 2023-11-02 ENCOUNTER — Inpatient Hospital Stay: Payer: BC Managed Care – PPO | Attending: Physician Assistant

## 2023-11-02 DIAGNOSIS — F17290 Nicotine dependence, other tobacco product, uncomplicated: Secondary | ICD-10-CM | POA: Insufficient documentation

## 2023-11-02 DIAGNOSIS — Z79624 Long term (current) use of inhibitors of nucleotide synthesis: Secondary | ICD-10-CM | POA: Insufficient documentation

## 2023-11-03 DIAGNOSIS — M7502 Adhesive capsulitis of left shoulder: Secondary | ICD-10-CM | POA: Diagnosis not present

## 2023-11-03 DIAGNOSIS — M6281 Muscle weakness (generalized): Secondary | ICD-10-CM | POA: Diagnosis not present

## 2023-11-11 DIAGNOSIS — B009 Herpesviral infection, unspecified: Secondary | ICD-10-CM | POA: Diagnosis not present

## 2023-11-11 DIAGNOSIS — F900 Attention-deficit hyperactivity disorder, predominantly inattentive type: Secondary | ICD-10-CM | POA: Diagnosis not present

## 2023-11-11 DIAGNOSIS — I10 Essential (primary) hypertension: Secondary | ICD-10-CM | POA: Diagnosis not present

## 2023-11-12 DIAGNOSIS — M6281 Muscle weakness (generalized): Secondary | ICD-10-CM | POA: Diagnosis not present

## 2023-11-12 DIAGNOSIS — M7502 Adhesive capsulitis of left shoulder: Secondary | ICD-10-CM | POA: Diagnosis not present

## 2023-11-16 ENCOUNTER — Inpatient Hospital Stay (HOSPITAL_BASED_OUTPATIENT_CLINIC_OR_DEPARTMENT_OTHER): Payer: BC Managed Care – PPO

## 2023-11-16 ENCOUNTER — Inpatient Hospital Stay: Payer: BC Managed Care – PPO

## 2023-11-16 ENCOUNTER — Inpatient Hospital Stay: Payer: BC Managed Care – PPO | Admitting: Hematology

## 2023-11-16 DIAGNOSIS — F17290 Nicotine dependence, other tobacco product, uncomplicated: Secondary | ICD-10-CM | POA: Diagnosis not present

## 2023-11-16 DIAGNOSIS — Z79624 Long term (current) use of inhibitors of nucleotide synthesis: Secondary | ICD-10-CM | POA: Diagnosis not present

## 2023-11-16 LAB — CBC WITH DIFFERENTIAL (CANCER CENTER ONLY)
Abs Immature Granulocytes: 0.02 10*3/uL (ref 0.00–0.07)
Basophils Absolute: 0 10*3/uL (ref 0.0–0.1)
Basophils Relative: 1 %
Eosinophils Absolute: 0.2 10*3/uL (ref 0.0–0.5)
Eosinophils Relative: 4 %
HCT: 49.4 % (ref 39.0–52.0)
Hemoglobin: 17.1 g/dL — ABNORMAL HIGH (ref 13.0–17.0)
Immature Granulocytes: 0 %
Lymphocytes Relative: 39 %
Lymphs Abs: 1.9 10*3/uL (ref 0.7–4.0)
MCH: 32.5 pg (ref 26.0–34.0)
MCHC: 34.6 g/dL (ref 30.0–36.0)
MCV: 93.9 fL (ref 80.0–100.0)
Monocytes Absolute: 0.5 10*3/uL (ref 0.1–1.0)
Monocytes Relative: 9 %
Neutro Abs: 2.3 10*3/uL (ref 1.7–7.7)
Neutrophils Relative %: 47 %
Platelet Count: 183 10*3/uL (ref 150–400)
RBC: 5.26 MIL/uL (ref 4.22–5.81)
RDW: 13.2 % (ref 11.5–15.5)
WBC Count: 4.9 10*3/uL (ref 4.0–10.5)
nRBC: 0 % (ref 0.0–0.2)

## 2023-11-16 LAB — CMP (CANCER CENTER ONLY)
ALT: 39 U/L (ref 0–44)
AST: 25 U/L (ref 15–41)
Albumin: 4.3 g/dL (ref 3.5–5.0)
Alkaline Phosphatase: 51 U/L (ref 38–126)
Anion gap: 7 (ref 5–15)
BUN: 15 mg/dL (ref 6–20)
CO2: 24 mmol/L (ref 22–32)
Calcium: 9.6 mg/dL (ref 8.9–10.3)
Chloride: 109 mmol/L (ref 98–111)
Creatinine: 0.95 mg/dL (ref 0.61–1.24)
GFR, Estimated: >60 mL/min (ref >60)
Glucose, Bld: 115 mg/dL — ABNORMAL HIGH (ref 70–99)
Potassium: 3.7 mmol/L (ref 3.5–5.1)
Sodium: 140 mmol/L (ref 135–145)
Total Bilirubin: 1.5 mg/dL — ABNORMAL HIGH (ref 0.0–1.2)
Total Protein: 6.6 g/dL (ref 6.5–8.1)

## 2023-11-16 LAB — FERRITIN: Ferritin: 80 ng/mL (ref 24–336)

## 2023-11-16 NOTE — Progress Notes (Signed)
Patient seen by Dr. Addison Naegeli are not all within treatment parameters.    Dr Candise Che aware BP 142/106  Labs reviewed: and are within treatment parameters.   Per physician team, patient is ready for treatment and there are NO modifications to the treatment plan. Pt may proceed with Phlebotomy with Ferritin pending

## 2023-11-16 NOTE — Patient Instructions (Signed)

## 2023-11-16 NOTE — Progress Notes (Signed)
Linda Hedges presents today for phlebotomy per MD orders. Phlebotomy procedure started at 1153 and ended at 1157 using 16G phlebotomy kit. 531 grams removed. Patient observed for 10 minutes after procedure without any incident- patient declined to stay for full 30 minute wait and declined food/beverage. Patient tolerated procedure well. IV needle removed intact.

## 2023-11-16 NOTE — Progress Notes (Signed)
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   HISTORY OF PRESENTING ILLNESS:  Frank Huerta 43 y.o. male returns for a follow up for hereditary hemochromatosis.   Patient was last seen by me on 09/29/2023 and he complained of lethargy. He wanted to change his weekly phlebotomies to bi-weekly. Patient reported consuming alcohol, red meat, and seafood.   Patient notes he has been doing well overall since our last visit. Patient notes he did consume seafood since our last visit. He denies any new infection issues, fever, chills, night sweats, unexpected weight loss, chest pain, SOB, back pain, abdominal pain, or leg swelling.   He does complain of chronic diarrhea. Patient has been to Gastroenterologist in the past. He is unsure if any food contributes to diarrhea.   Patient notes that he has been taking metamucil daily.   FmHx of dad has prostate cancer.  Patient notes he has cut down smoking.    MEDICAL HISTORY:  Past Medical History:  Diagnosis Date   Attention deficit disorder (ADD)    Cocaine abuse (HCC)    GAD (generalized anxiety disorder)    Hemorrhoids    History of condyloma acuminatum 11/2003   urethral    SURGICAL HISTORY: Past Surgical History:  Procedure Laterality Date   URETHROPLASTY  12/12/2003   @[facility]  by dr s. Patsi Sears;   meatotomy,  excision wart distal urethral, cystoscopy, and urethroplasty    SOCIAL HISTORY: Social History   Socioeconomic History   Marital status: Single    Spouse name: Not on file   Number of children: Not on file   Years of education: Not on file   Highest education level: Not on file  Occupational History   Not on  file  Tobacco Use   Smoking status: Every Day    Types: E-cigarettes   Smokeless tobacco: Never  Vaping Use   Vaping status: Every Day  Substance and Sexual Activity   Alcohol use: Yes    Comment: three times weekly   Drug use: Yes    Types: Cocaine   Sexual activity: Not on file  Other Topics Concern   Not on file  Social History Narrative   Not on file   Social Drivers of Health   Financial Resource Strain: Not on file  Food Insecurity: Not on file  Transportation Needs: Not on file  Physical Activity: Not on file  Stress: Not on file  Social Connections: Not on file  Intimate Partner Violence: Not on file    FAMILY HISTORY: No family history on file.  ALLERGIES:  has no known allergies.  MEDICATIONS:  Current Outpatient Medications  Medication Sig Dispense Refill   amphetamine-dextroamphetamine (ADDERALL) 10 MG tablet Take 5 mg by mouth daily as needed.     LORazepam (ATIVAN) 0.5 MG tablet Take 1 tablet (0.5 mg total) by mouth as needed for anxiety (before weekly phlebotomy). 30 tablet 0   nicotine (NICODERM CQ - DOSED IN MG/24 HOURS) 14 mg/24hr patch Place 14 mg onto the skin daily.     oxyCODONE-acetaminophen (PERCOCET) 5-325 MG tablet Take 1 tablet by mouth every 4 (four) hours as needed. (Patient not taking: Reported on 06/11/2023) 15 tablet 0   Peppermint Oil (IBGARD) 90 MG CPCR 2 capsules Orally  once a day     valACYclovir (VALTREX) 1000 MG tablet Take 1 tablet (1,000 mg total) by mouth 2 (two) times daily. 10 tablet 2   No current facility-administered medications for this visit.    REVIEW OF SYSTEMS:   .10 Point review of Systems was done is negative except as noted above.   PHYSICAL EXAMINATION: ECOG PERFORMANCE STATUS: 0 - Asymptomatic  Vitals:   11/16/23 1054  BP: (!) 142/106  Pulse: 87  Resp: 16  Temp: 97.8 F (36.6 C)  SpO2: 99%    Filed Weights   11/16/23 1054  Weight: 222 lb (100.7 kg)     GENERAL:alert, in no acute distress and  comfortable SKIN: no acute rashes, no significant lesions EYES: conjunctiva are pink and non-injected, sclera anicteric OROPHARYNX: MMM, no exudates, no oropharyngeal erythema or ulceration NECK: supple, no JVD LYMPH:  no palpable lymphadenopathy in the cervical, axillary or inguinal regions LUNGS: clear to auscultation b/l with normal respiratory effort HEART: regular rate & rhythm ABDOMEN:  normoactive bowel sounds , non tender, not distended. Extremity: no pedal edema PSYCH: alert & oriented x 3 with fluent speech NEURO: no focal motor/sensory deficits    LABORATORY DATA:  I have reviewed the data as listed    Latest Ref Rng & Units 11/16/2023   10:14 AM 09/29/2023    8:54 AM 09/21/2023    8:02 AM  CBC  WBC 4.0 - 10.5 K/uL 4.9  3.9  5.8   Hemoglobin 13.0 - 17.0 g/dL 16.1  09.6  04.5   Hematocrit 39.0 - 52.0 % 49.4  42.8  45.4   Platelets 150 - 400 K/uL 183  221  187        Latest Ref Rng & Units 11/16/2023   10:14 AM 09/29/2023    8:54 AM 09/21/2023    8:02 AM  CMP  Glucose 70 - 99 mg/dL 409  811  914   BUN 6 - 20 mg/dL 15  12  15    Creatinine 0.61 - 1.24 mg/dL 7.82  9.56  2.13   Sodium 135 - 145 mmol/L 140  139  138   Potassium 3.5 - 5.1 mmol/L 3.7  3.6  3.7   Chloride 98 - 111 mmol/L 109  106  106   CO2 22 - 32 mmol/L 24  27  24    Calcium 8.9 - 10.3 mg/dL 9.6  9.7  9.8   Total Protein 6.5 - 8.1 g/dL 6.6  6.5  7.0   Total Bilirubin 0.0 - 1.2 mg/dL 1.5  0.8  1.3   Alkaline Phos 38 - 126 U/L 51  44  43   AST 15 - 41 U/L 25  19  23    ALT 0 - 44 U/L 39  24  38    (Iron and TIBC)  Lab Results  Component Value Date   FERRITIN 66 11/16/2023      RADIOGRAPHIC STUDIES: I have personally reviewed the radiological images as listed and agreed with the findings in the report. No results found.  ASSESSMENT & PLAN  Frank Huerta is a 43 y.o. male who presents for evaluation of hereditary hemochromatosis. Hereditary hemochromatosis is a hereditary condition caused by  mutations in the HFE gene, which regulates iron absorption. The most common genes mutated in this condition are the C282Y and H63D genes. Based on outside testing, Mr. Goyne has evidence of homozygous mutation of C282Y gene.Homozygous mutations represent a disease state which requires phlebotomy to decrease ferritin levels  to a goal of <50  (Blood (2010) 116 (3): 317-325). The goal is to decrease ferritin so there is no deposition in critical organs (liver, heart, pancreas and thyroid).   #Hereditary Hemochromatosis-homozygous mutation of C282Y gene --Started weekly phlebotomies on 06/14/2023 with goal ferritin <50.  --Outside US liver from 02/05/2023 showed hepatomegaly with fatty liver infiltration. No evidence of iron deposition.  --Echo showed no evidence of iron deposition. EF 65-60%. --Liver biopsy from 07/07/2023. 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     Hemochromatosis Ferritin levels have significantly decreased from 7000 to 102 after 14 weeks of weekly phlebotomies. Liver function tests have normalized. Patient has ceased alcohol consumption.  Low Testosterone Patient reports low energy and has a testosterone level of 309, which is on the lower end of the normal range. Patient has previously used testosterone replacement therapy and is considering it again. -Advise patient to continue with lifestyle modifications including exercise and maintaining a healthy diet. -Reevaluate testosterone levels in six months, after iron levels have stabilized, before considering testosterone replacement therapy.  Alcohol Consumption Patient has ceased alcohol consumption which has contributed to improvement in liver function tests. -Encourage continued abstinence from alcohol.  General Health Maintenance -Advise patient to check the iron content of any pre-workout  supplements. -Consider potential effects of caffeine and other ingredients in pre-workout supplements on overall health.   PLAN: -Discussed lab results from today, 11/16/2023, in detail with the patient. CBC shows elevated hemoglobin of 17.1 g/dL with normal hematocrit of 49.4%. CMP stable. -Will change phlebotomies to once a month.  -Educated the patient about smoking cessation and alcohol sensation.  -discussed with the patient that metamucil could cause diarrhea. Try to hold metamucil.  -Answered all of patient's question.   FOLLOW-UP: Change therpaeutic phlebotomies to every 4 weeks with labs  MD visit with Dr Candise Che in 16 weeks with labs  The total time spent in the appointment was 21 minutes* .  All of the patient's questions were answered with apparent satisfaction. The patient knows to call the clinic with any problems, questions or concerns.   Wyvonnia Lora MD MS AAHIVMS [facility] Hematology/Oncology Physician [facility]  .*Total Encounter Time as defined by the Centers for Medicare and Medicaid Services includes, in addition to the face-to-face time of a patient visit (documented in the note above) non-face-to-face time: obtaining and reviewing outside history, ordering and reviewing medications, tests or procedures, care coordination (communications with other health care professionals or caregivers) and documentation in the medical record.   I,Param Shah,acting as a Neurosurgeon for Wyvonnia Lora, MD.,have documented all relevant documentation on the behalf of Wyvonnia Lora, MD,as directed by  Wyvonnia Lora, MD while in the presence of Wyvonnia Lora, MD.  .I have reviewed the above documentation for accuracy and completeness, and I agree with the above. Johney Maine MD

## 2023-11-17 ENCOUNTER — Telehealth: Payer: Self-pay | Admitting: Hematology

## 2023-11-17 NOTE — Telephone Encounter (Signed)
Left patient a vm regarding upcoming appointments  

## 2023-11-22 ENCOUNTER — Encounter: Payer: Self-pay | Admitting: Physician Assistant

## 2023-11-24 DIAGNOSIS — M7502 Adhesive capsulitis of left shoulder: Secondary | ICD-10-CM | POA: Diagnosis not present

## 2023-11-24 DIAGNOSIS — M6281 Muscle weakness (generalized): Secondary | ICD-10-CM | POA: Diagnosis not present

## 2023-11-26 DIAGNOSIS — A6001 Herpesviral infection of penis: Secondary | ICD-10-CM | POA: Diagnosis not present

## 2023-11-26 DIAGNOSIS — M7502 Adhesive capsulitis of left shoulder: Secondary | ICD-10-CM | POA: Diagnosis not present

## 2023-11-26 DIAGNOSIS — M6281 Muscle weakness (generalized): Secondary | ICD-10-CM | POA: Diagnosis not present

## 2023-11-26 DIAGNOSIS — L720 Epidermal cyst: Secondary | ICD-10-CM | POA: Diagnosis not present

## 2023-11-30 ENCOUNTER — Inpatient Hospital Stay: Payer: BC Managed Care – PPO

## 2023-12-07 DIAGNOSIS — S43142D Inferior dislocation of left acromioclavicular joint, subsequent encounter: Secondary | ICD-10-CM | POA: Diagnosis not present

## 2023-12-09 DIAGNOSIS — F4322 Adjustment disorder with anxiety: Secondary | ICD-10-CM | POA: Diagnosis not present

## 2023-12-13 ENCOUNTER — Other Ambulatory Visit: Payer: Self-pay

## 2023-12-13 DIAGNOSIS — T8484XA Pain due to internal orthopedic prosthetic devices, implants and grafts, initial encounter: Secondary | ICD-10-CM | POA: Diagnosis not present

## 2023-12-13 DIAGNOSIS — T849XXA Unspecified complication of internal orthopedic prosthetic device, implant and graft, initial encounter: Secondary | ICD-10-CM | POA: Diagnosis not present

## 2023-12-13 DIAGNOSIS — X58XXXA Exposure to other specified factors, initial encounter: Secondary | ICD-10-CM | POA: Diagnosis not present

## 2023-12-13 DIAGNOSIS — M7502 Adhesive capsulitis of left shoulder: Secondary | ICD-10-CM | POA: Diagnosis not present

## 2023-12-13 DIAGNOSIS — G8918 Other acute postprocedural pain: Secondary | ICD-10-CM | POA: Diagnosis not present

## 2023-12-13 DIAGNOSIS — M7522 Bicipital tendinitis, left shoulder: Secondary | ICD-10-CM | POA: Diagnosis not present

## 2023-12-13 DIAGNOSIS — Y999 Unspecified external cause status: Secondary | ICD-10-CM | POA: Diagnosis not present

## 2023-12-13 DIAGNOSIS — S43432A Superior glenoid labrum lesion of left shoulder, initial encounter: Secondary | ICD-10-CM | POA: Diagnosis not present

## 2023-12-14 ENCOUNTER — Inpatient Hospital Stay: Payer: BC Managed Care – PPO

## 2023-12-14 ENCOUNTER — Inpatient Hospital Stay: Payer: BC Managed Care – PPO | Admitting: Hematology

## 2023-12-14 DIAGNOSIS — M25612 Stiffness of left shoulder, not elsewhere classified: Secondary | ICD-10-CM | POA: Diagnosis not present

## 2023-12-14 DIAGNOSIS — M7502 Adhesive capsulitis of left shoulder: Secondary | ICD-10-CM | POA: Diagnosis not present

## 2023-12-14 DIAGNOSIS — M6281 Muscle weakness (generalized): Secondary | ICD-10-CM | POA: Diagnosis not present

## 2023-12-14 DIAGNOSIS — S46112D Strain of muscle, fascia and tendon of long head of biceps, left arm, subsequent encounter: Secondary | ICD-10-CM | POA: Diagnosis not present

## 2023-12-15 DIAGNOSIS — S46112D Strain of muscle, fascia and tendon of long head of biceps, left arm, subsequent encounter: Secondary | ICD-10-CM | POA: Diagnosis not present

## 2023-12-15 DIAGNOSIS — M6281 Muscle weakness (generalized): Secondary | ICD-10-CM | POA: Diagnosis not present

## 2023-12-15 DIAGNOSIS — M7502 Adhesive capsulitis of left shoulder: Secondary | ICD-10-CM | POA: Diagnosis not present

## 2023-12-15 DIAGNOSIS — M25612 Stiffness of left shoulder, not elsewhere classified: Secondary | ICD-10-CM | POA: Diagnosis not present

## 2023-12-16 DIAGNOSIS — M25612 Stiffness of left shoulder, not elsewhere classified: Secondary | ICD-10-CM | POA: Diagnosis not present

## 2023-12-16 DIAGNOSIS — M6281 Muscle weakness (generalized): Secondary | ICD-10-CM | POA: Diagnosis not present

## 2023-12-16 DIAGNOSIS — M7502 Adhesive capsulitis of left shoulder: Secondary | ICD-10-CM | POA: Diagnosis not present

## 2023-12-16 DIAGNOSIS — S46112D Strain of muscle, fascia and tendon of long head of biceps, left arm, subsequent encounter: Secondary | ICD-10-CM | POA: Diagnosis not present

## 2023-12-17 DIAGNOSIS — M25612 Stiffness of left shoulder, not elsewhere classified: Secondary | ICD-10-CM | POA: Diagnosis not present

## 2023-12-17 DIAGNOSIS — M7502 Adhesive capsulitis of left shoulder: Secondary | ICD-10-CM | POA: Diagnosis not present

## 2023-12-17 DIAGNOSIS — S46112D Strain of muscle, fascia and tendon of long head of biceps, left arm, subsequent encounter: Secondary | ICD-10-CM | POA: Diagnosis not present

## 2023-12-17 DIAGNOSIS — M6281 Muscle weakness (generalized): Secondary | ICD-10-CM | POA: Diagnosis not present

## 2023-12-20 DIAGNOSIS — S46112D Strain of muscle, fascia and tendon of long head of biceps, left arm, subsequent encounter: Secondary | ICD-10-CM | POA: Diagnosis not present

## 2023-12-20 DIAGNOSIS — M25612 Stiffness of left shoulder, not elsewhere classified: Secondary | ICD-10-CM | POA: Diagnosis not present

## 2023-12-20 DIAGNOSIS — M6281 Muscle weakness (generalized): Secondary | ICD-10-CM | POA: Diagnosis not present

## 2023-12-20 DIAGNOSIS — M7502 Adhesive capsulitis of left shoulder: Secondary | ICD-10-CM | POA: Diagnosis not present

## 2023-12-21 ENCOUNTER — Inpatient Hospital Stay: Payer: BC Managed Care – PPO

## 2023-12-21 ENCOUNTER — Inpatient Hospital Stay: Payer: BC Managed Care – PPO | Attending: Physician Assistant

## 2023-12-21 DIAGNOSIS — M7502 Adhesive capsulitis of left shoulder: Secondary | ICD-10-CM | POA: Diagnosis not present

## 2023-12-21 DIAGNOSIS — S46112D Strain of muscle, fascia and tendon of long head of biceps, left arm, subsequent encounter: Secondary | ICD-10-CM | POA: Diagnosis not present

## 2023-12-21 DIAGNOSIS — M6281 Muscle weakness (generalized): Secondary | ICD-10-CM | POA: Diagnosis not present

## 2023-12-21 DIAGNOSIS — M25612 Stiffness of left shoulder, not elsewhere classified: Secondary | ICD-10-CM | POA: Diagnosis not present

## 2023-12-21 LAB — CMP (CANCER CENTER ONLY)
ALT: 29 U/L (ref 0–44)
AST: 22 U/L (ref 15–41)
Albumin: 4.6 g/dL (ref 3.5–5.0)
Alkaline Phosphatase: 54 U/L (ref 38–126)
Anion gap: 7 (ref 5–15)
BUN: 14 mg/dL (ref 6–20)
CO2: 25 mmol/L (ref 22–32)
Calcium: 9.6 mg/dL (ref 8.9–10.3)
Chloride: 107 mmol/L (ref 98–111)
Creatinine: 1.14 mg/dL (ref 0.61–1.24)
GFR, Estimated: >60 mL/min (ref >60)
Glucose, Bld: 122 mg/dL — ABNORMAL HIGH (ref 70–99)
Potassium: 3.9 mmol/L (ref 3.5–5.1)
Sodium: 139 mmol/L (ref 135–145)
Total Bilirubin: 2 mg/dL — ABNORMAL HIGH (ref 0.0–1.2)
Total Protein: 7.1 g/dL (ref 6.5–8.1)

## 2023-12-21 LAB — CBC WITH DIFFERENTIAL (CANCER CENTER ONLY)
Abs Immature Granulocytes: 0.03 10*3/uL (ref 0.00–0.07)
Basophils Absolute: 0.1 10*3/uL (ref 0.0–0.1)
Basophils Relative: 1 %
Eosinophils Absolute: 0.4 10*3/uL (ref 0.0–0.5)
Eosinophils Relative: 6 %
HCT: 51.7 % (ref 39.0–52.0)
Hemoglobin: 18.1 g/dL — ABNORMAL HIGH (ref 13.0–17.0)
Immature Granulocytes: 0 %
Lymphocytes Relative: 30 %
Lymphs Abs: 2.1 10*3/uL (ref 0.7–4.0)
MCH: 32.1 pg (ref 26.0–34.0)
MCHC: 35 g/dL (ref 30.0–36.0)
MCV: 91.8 fL (ref 80.0–100.0)
Monocytes Absolute: 0.4 10*3/uL (ref 0.1–1.0)
Monocytes Relative: 6 %
Neutro Abs: 3.9 10*3/uL (ref 1.7–7.7)
Neutrophils Relative %: 57 %
Platelet Count: 237 10*3/uL (ref 150–400)
RBC: 5.63 MIL/uL (ref 4.22–5.81)
RDW: 13.1 % (ref 11.5–15.5)
WBC Count: 6.9 10*3/uL (ref 4.0–10.5)
nRBC: 0 % (ref 0.0–0.2)

## 2023-12-21 LAB — FERRITIN: Ferritin: 39 ng/mL (ref 24–336)

## 2023-12-21 NOTE — Progress Notes (Signed)
 Frank Huerta presents today for phlebotomy per MD orders. Phlebotomy procedure started at 1452 and ended at 1500. 501 grams removed via RAC 16G Patient observed for 10 minutes after procedure without any incident. Patient tolerated procedure well. IV needle removed intact.

## 2023-12-21 NOTE — Patient Instructions (Signed)

## 2023-12-22 DIAGNOSIS — F4322 Adjustment disorder with anxiety: Secondary | ICD-10-CM | POA: Diagnosis not present

## 2023-12-22 DIAGNOSIS — M6281 Muscle weakness (generalized): Secondary | ICD-10-CM | POA: Diagnosis not present

## 2023-12-22 DIAGNOSIS — S46112D Strain of muscle, fascia and tendon of long head of biceps, left arm, subsequent encounter: Secondary | ICD-10-CM | POA: Diagnosis not present

## 2023-12-22 DIAGNOSIS — M25612 Stiffness of left shoulder, not elsewhere classified: Secondary | ICD-10-CM | POA: Diagnosis not present

## 2023-12-22 DIAGNOSIS — M7502 Adhesive capsulitis of left shoulder: Secondary | ICD-10-CM | POA: Diagnosis not present

## 2023-12-23 DIAGNOSIS — M7502 Adhesive capsulitis of left shoulder: Secondary | ICD-10-CM | POA: Diagnosis not present

## 2023-12-23 DIAGNOSIS — M6281 Muscle weakness (generalized): Secondary | ICD-10-CM | POA: Diagnosis not present

## 2023-12-23 DIAGNOSIS — M25612 Stiffness of left shoulder, not elsewhere classified: Secondary | ICD-10-CM | POA: Diagnosis not present

## 2023-12-23 DIAGNOSIS — S46112D Strain of muscle, fascia and tendon of long head of biceps, left arm, subsequent encounter: Secondary | ICD-10-CM | POA: Diagnosis not present

## 2023-12-27 ENCOUNTER — Encounter: Payer: Self-pay | Admitting: Hematology

## 2023-12-27 DIAGNOSIS — M25612 Stiffness of left shoulder, not elsewhere classified: Secondary | ICD-10-CM | POA: Diagnosis not present

## 2023-12-27 DIAGNOSIS — M6281 Muscle weakness (generalized): Secondary | ICD-10-CM | POA: Diagnosis not present

## 2023-12-27 DIAGNOSIS — S46112D Strain of muscle, fascia and tendon of long head of biceps, left arm, subsequent encounter: Secondary | ICD-10-CM | POA: Diagnosis not present

## 2023-12-27 DIAGNOSIS — M7502 Adhesive capsulitis of left shoulder: Secondary | ICD-10-CM | POA: Diagnosis not present

## 2023-12-29 DIAGNOSIS — M25612 Stiffness of left shoulder, not elsewhere classified: Secondary | ICD-10-CM | POA: Diagnosis not present

## 2023-12-29 DIAGNOSIS — F4322 Adjustment disorder with anxiety: Secondary | ICD-10-CM | POA: Diagnosis not present

## 2023-12-29 DIAGNOSIS — M7502 Adhesive capsulitis of left shoulder: Secondary | ICD-10-CM | POA: Diagnosis not present

## 2023-12-29 DIAGNOSIS — M6281 Muscle weakness (generalized): Secondary | ICD-10-CM | POA: Diagnosis not present

## 2023-12-29 DIAGNOSIS — S46112D Strain of muscle, fascia and tendon of long head of biceps, left arm, subsequent encounter: Secondary | ICD-10-CM | POA: Diagnosis not present

## 2024-01-04 DIAGNOSIS — M25612 Stiffness of left shoulder, not elsewhere classified: Secondary | ICD-10-CM | POA: Diagnosis not present

## 2024-01-04 DIAGNOSIS — S46112D Strain of muscle, fascia and tendon of long head of biceps, left arm, subsequent encounter: Secondary | ICD-10-CM | POA: Diagnosis not present

## 2024-01-04 DIAGNOSIS — M7502 Adhesive capsulitis of left shoulder: Secondary | ICD-10-CM | POA: Diagnosis not present

## 2024-01-04 DIAGNOSIS — M6281 Muscle weakness (generalized): Secondary | ICD-10-CM | POA: Diagnosis not present

## 2024-01-10 DIAGNOSIS — M25612 Stiffness of left shoulder, not elsewhere classified: Secondary | ICD-10-CM | POA: Diagnosis not present

## 2024-01-10 DIAGNOSIS — M6281 Muscle weakness (generalized): Secondary | ICD-10-CM | POA: Diagnosis not present

## 2024-01-10 DIAGNOSIS — M7502 Adhesive capsulitis of left shoulder: Secondary | ICD-10-CM | POA: Diagnosis not present

## 2024-01-10 DIAGNOSIS — S46112D Strain of muscle, fascia and tendon of long head of biceps, left arm, subsequent encounter: Secondary | ICD-10-CM | POA: Diagnosis not present

## 2024-01-11 ENCOUNTER — Inpatient Hospital Stay: Payer: BC Managed Care – PPO

## 2024-01-13 DIAGNOSIS — F4322 Adjustment disorder with anxiety: Secondary | ICD-10-CM | POA: Diagnosis not present

## 2024-01-14 DIAGNOSIS — S46112D Strain of muscle, fascia and tendon of long head of biceps, left arm, subsequent encounter: Secondary | ICD-10-CM | POA: Diagnosis not present

## 2024-01-14 DIAGNOSIS — M7502 Adhesive capsulitis of left shoulder: Secondary | ICD-10-CM | POA: Diagnosis not present

## 2024-01-14 DIAGNOSIS — M25612 Stiffness of left shoulder, not elsewhere classified: Secondary | ICD-10-CM | POA: Diagnosis not present

## 2024-01-14 DIAGNOSIS — M6281 Muscle weakness (generalized): Secondary | ICD-10-CM | POA: Diagnosis not present

## 2024-01-19 DIAGNOSIS — F1020 Alcohol dependence, uncomplicated: Secondary | ICD-10-CM | POA: Diagnosis not present

## 2024-01-20 DIAGNOSIS — S46112D Strain of muscle, fascia and tendon of long head of biceps, left arm, subsequent encounter: Secondary | ICD-10-CM | POA: Diagnosis not present

## 2024-01-20 DIAGNOSIS — M6281 Muscle weakness (generalized): Secondary | ICD-10-CM | POA: Diagnosis not present

## 2024-01-20 DIAGNOSIS — F1020 Alcohol dependence, uncomplicated: Secondary | ICD-10-CM | POA: Diagnosis not present

## 2024-01-20 DIAGNOSIS — M7502 Adhesive capsulitis of left shoulder: Secondary | ICD-10-CM | POA: Diagnosis not present

## 2024-01-20 DIAGNOSIS — M25612 Stiffness of left shoulder, not elsewhere classified: Secondary | ICD-10-CM | POA: Diagnosis not present

## 2024-01-21 DIAGNOSIS — F1020 Alcohol dependence, uncomplicated: Secondary | ICD-10-CM | POA: Diagnosis not present

## 2024-01-24 DIAGNOSIS — F1020 Alcohol dependence, uncomplicated: Secondary | ICD-10-CM | POA: Diagnosis not present

## 2024-01-25 DIAGNOSIS — S46112D Strain of muscle, fascia and tendon of long head of biceps, left arm, subsequent encounter: Secondary | ICD-10-CM | POA: Diagnosis not present

## 2024-01-25 DIAGNOSIS — M25612 Stiffness of left shoulder, not elsewhere classified: Secondary | ICD-10-CM | POA: Diagnosis not present

## 2024-01-25 DIAGNOSIS — F1020 Alcohol dependence, uncomplicated: Secondary | ICD-10-CM | POA: Diagnosis not present

## 2024-01-25 DIAGNOSIS — M6281 Muscle weakness (generalized): Secondary | ICD-10-CM | POA: Diagnosis not present

## 2024-01-25 DIAGNOSIS — M7502 Adhesive capsulitis of left shoulder: Secondary | ICD-10-CM | POA: Diagnosis not present

## 2024-01-26 DIAGNOSIS — F1020 Alcohol dependence, uncomplicated: Secondary | ICD-10-CM | POA: Diagnosis not present

## 2024-01-27 DIAGNOSIS — F1020 Alcohol dependence, uncomplicated: Secondary | ICD-10-CM | POA: Diagnosis not present

## 2024-01-28 DIAGNOSIS — F1020 Alcohol dependence, uncomplicated: Secondary | ICD-10-CM | POA: Diagnosis not present

## 2024-01-31 DIAGNOSIS — F1020 Alcohol dependence, uncomplicated: Secondary | ICD-10-CM | POA: Diagnosis not present

## 2024-02-01 DIAGNOSIS — M25612 Stiffness of left shoulder, not elsewhere classified: Secondary | ICD-10-CM | POA: Diagnosis not present

## 2024-02-01 DIAGNOSIS — M7502 Adhesive capsulitis of left shoulder: Secondary | ICD-10-CM | POA: Diagnosis not present

## 2024-02-01 DIAGNOSIS — F1020 Alcohol dependence, uncomplicated: Secondary | ICD-10-CM | POA: Diagnosis not present

## 2024-02-01 DIAGNOSIS — M6281 Muscle weakness (generalized): Secondary | ICD-10-CM | POA: Diagnosis not present

## 2024-02-01 DIAGNOSIS — S46112D Strain of muscle, fascia and tendon of long head of biceps, left arm, subsequent encounter: Secondary | ICD-10-CM | POA: Diagnosis not present

## 2024-02-02 DIAGNOSIS — F1020 Alcohol dependence, uncomplicated: Secondary | ICD-10-CM | POA: Diagnosis not present

## 2024-02-03 DIAGNOSIS — F1020 Alcohol dependence, uncomplicated: Secondary | ICD-10-CM | POA: Diagnosis not present

## 2024-02-04 DIAGNOSIS — F1020 Alcohol dependence, uncomplicated: Secondary | ICD-10-CM | POA: Diagnosis not present

## 2024-02-07 DIAGNOSIS — F1020 Alcohol dependence, uncomplicated: Secondary | ICD-10-CM | POA: Diagnosis not present

## 2024-02-08 ENCOUNTER — Inpatient Hospital Stay: Payer: BC Managed Care – PPO

## 2024-02-08 DIAGNOSIS — M6281 Muscle weakness (generalized): Secondary | ICD-10-CM | POA: Diagnosis not present

## 2024-02-08 DIAGNOSIS — M7502 Adhesive capsulitis of left shoulder: Secondary | ICD-10-CM | POA: Diagnosis not present

## 2024-02-08 DIAGNOSIS — F1020 Alcohol dependence, uncomplicated: Secondary | ICD-10-CM | POA: Diagnosis not present

## 2024-02-08 DIAGNOSIS — M25612 Stiffness of left shoulder, not elsewhere classified: Secondary | ICD-10-CM | POA: Diagnosis not present

## 2024-02-08 DIAGNOSIS — S46112D Strain of muscle, fascia and tendon of long head of biceps, left arm, subsequent encounter: Secondary | ICD-10-CM | POA: Diagnosis not present

## 2024-02-09 DIAGNOSIS — F1020 Alcohol dependence, uncomplicated: Secondary | ICD-10-CM | POA: Diagnosis not present

## 2024-02-15 DIAGNOSIS — M6281 Muscle weakness (generalized): Secondary | ICD-10-CM | POA: Diagnosis not present

## 2024-02-15 DIAGNOSIS — M25612 Stiffness of left shoulder, not elsewhere classified: Secondary | ICD-10-CM | POA: Diagnosis not present

## 2024-02-15 DIAGNOSIS — S46112D Strain of muscle, fascia and tendon of long head of biceps, left arm, subsequent encounter: Secondary | ICD-10-CM | POA: Diagnosis not present

## 2024-02-15 DIAGNOSIS — M7502 Adhesive capsulitis of left shoulder: Secondary | ICD-10-CM | POA: Diagnosis not present

## 2024-02-15 DIAGNOSIS — F1020 Alcohol dependence, uncomplicated: Secondary | ICD-10-CM | POA: Diagnosis not present

## 2024-02-21 DIAGNOSIS — F900 Attention-deficit hyperactivity disorder, predominantly inattentive type: Secondary | ICD-10-CM | POA: Diagnosis not present

## 2024-02-21 DIAGNOSIS — F1020 Alcohol dependence, uncomplicated: Secondary | ICD-10-CM | POA: Diagnosis not present

## 2024-02-21 DIAGNOSIS — I10 Essential (primary) hypertension: Secondary | ICD-10-CM | POA: Diagnosis not present

## 2024-02-21 DIAGNOSIS — B009 Herpesviral infection, unspecified: Secondary | ICD-10-CM | POA: Diagnosis not present

## 2024-02-22 DIAGNOSIS — F1020 Alcohol dependence, uncomplicated: Secondary | ICD-10-CM | POA: Diagnosis not present

## 2024-02-23 DIAGNOSIS — F1020 Alcohol dependence, uncomplicated: Secondary | ICD-10-CM | POA: Diagnosis not present

## 2024-02-28 DIAGNOSIS — F1020 Alcohol dependence, uncomplicated: Secondary | ICD-10-CM | POA: Diagnosis not present

## 2024-02-29 DIAGNOSIS — F1020 Alcohol dependence, uncomplicated: Secondary | ICD-10-CM | POA: Diagnosis not present

## 2024-03-02 DIAGNOSIS — F1020 Alcohol dependence, uncomplicated: Secondary | ICD-10-CM | POA: Diagnosis not present

## 2024-03-06 ENCOUNTER — Inpatient Hospital Stay

## 2024-03-06 ENCOUNTER — Other Ambulatory Visit: Payer: Self-pay | Admitting: *Deleted

## 2024-03-06 ENCOUNTER — Telehealth: Payer: Self-pay | Admitting: *Deleted

## 2024-03-06 DIAGNOSIS — F1020 Alcohol dependence, uncomplicated: Secondary | ICD-10-CM | POA: Diagnosis not present

## 2024-03-06 NOTE — Telephone Encounter (Signed)
 PC to patient, no answer, left VM - informed him he has lab appointment tomorrow, 03/07/24 at 12:30 & an appointment to see Dr Salomon Cree at 1:00.  His phlebotomy appointment has been changed to 03/08/24 at 4:00 so that Dr Salomon Cree may review his labs before phlebotomy.  Patient instructed to contact our scheduling department if he needs to change any of these appointments, [phone].

## 2024-03-07 ENCOUNTER — Inpatient Hospital Stay: Payer: BC Managed Care – PPO | Admitting: Hematology

## 2024-03-07 ENCOUNTER — Telehealth: Payer: Self-pay | Admitting: *Deleted

## 2024-03-07 ENCOUNTER — Inpatient Hospital Stay: Payer: BC Managed Care – PPO | Attending: Physician Assistant

## 2024-03-07 ENCOUNTER — Inpatient Hospital Stay

## 2024-03-07 ENCOUNTER — Inpatient Hospital Stay: Payer: BC Managed Care – PPO

## 2024-03-07 ENCOUNTER — Inpatient Hospital Stay: Admitting: Hematology

## 2024-03-07 DIAGNOSIS — F1020 Alcohol dependence, uncomplicated: Secondary | ICD-10-CM | POA: Diagnosis not present

## 2024-03-07 NOTE — Telephone Encounter (Signed)
 Contacted patient after missed appts this morning. He states he left a MyChart message after yesterday's appts had to be rescheduled where he gave his current availability Per Dr. Epifania Haskell order, scheduled patient for labs tomorrow morning and will follow with phone visit later in week after labs to plan next. Sent MyChart message to patient with this information and asked that he let us  know his best time AM or PM for phone visit in few days after labs are done

## 2024-03-07 NOTE — Progress Notes (Incomplete)
 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   HISTORY OF PRESENTING ILLNESS:  Frank Huerta is a 43 y.o. male who returns for a follow up for hereditary hemochromatosis.   Patient was last seen by me on 11/16/2023 and complained of chronic diarrhea.    MEDICAL HISTORY:  Past Medical History:  Diagnosis Date   Attention deficit disorder (ADD)    Cocaine abuse (HCC)    GAD (generalized anxiety disorder)    Hemorrhoids    History of condyloma acuminatum 11/2003   urethral    SURGICAL HISTORY: Past Surgical History:  Procedure Laterality Date   URETHROPLASTY  12/12/2003   @[facility]  by dr s. Isla Mari;   meatotomy,  excision wart distal urethral, cystoscopy, and urethroplasty    SOCIAL HISTORY: Social History   Socioeconomic History   Marital status: Single    Spouse name: Not on file   Number of children: Not on file   Years of education: Not on file   Highest education level: Not on file  Occupational History   Not on file  Tobacco Use   Smoking status: Every Day    Types: E-cigarettes   Smokeless tobacco: Never  Vaping Use   Vaping status: Every Day  Substance and Sexual Activity   Alcohol use: Yes    Comment: three times weekly   Drug use: Yes    Types: Cocaine   Sexual activity: Not on file  Other Topics Concern   Not on file  Social History Narrative   Not on file   Social Drivers of Health   Financial Resource Strain: Not on file  Food Insecurity: Not on file  Transportation Needs: Not on file  Physical Activity: Not on file  Stress: Not on file  Social Connections: Not on file  Intimate Partner Violence: Not on file    FAMILY  HISTORY: No family history on file.  ALLERGIES:  has no known allergies.  MEDICATIONS:  Current Outpatient Medications  Medication Sig Dispense Refill   amphetamine-dextroamphetamine (ADDERALL) 10 MG tablet Take 5 mg by mouth daily as needed.     LORazepam  (ATIVAN ) 0.5 MG tablet Take 1 tablet (0.5 mg total) by mouth as needed for anxiety (before weekly phlebotomy). 30 tablet 0   nicotine (NICODERM CQ - DOSED IN MG/24 HOURS) 14 mg/24hr patch Place 14 mg onto the skin daily.     oxyCODONE -acetaminophen  (PERCOCET) 5-325 MG tablet Take 1 tablet by mouth every 4 (four) hours as needed. (Patient not taking: Reported on 11/16/2023) 15 tablet 0   Peppermint Oil (IBGARD) 90 MG CPCR 2 capsules Orally once a day     valACYclovir  (VALTREX ) 1000 MG tablet Take 1 tablet (1,000 mg total) by mouth 2 (two) times daily. 10 tablet 2   No current facility-administered medications for this visit.    REVIEW OF SYSTEMS:    10 Point review of Systems was done is negative except as noted above.   PHYSICAL EXAMINATION: ECOG PERFORMANCE STATUS: 0 - Asymptomatic  There were no vitals filed for this visit.   There were no vitals filed for this visit.   GENERAL:alert, in no acute distress and comfortable SKIN: no acute rashes, no significant lesions EYES: conjunctiva are pink and non-injected, sclera anicteric OROPHARYNX: MMM,  no exudates, no oropharyngeal erythema or ulceration NECK: supple, no JVD LYMPH:  no palpable lymphadenopathy in the cervical, axillary or inguinal regions LUNGS: clear to auscultation b/l with normal respiratory effort HEART: regular rate & rhythm ABDOMEN:  normoactive bowel sounds , non tender, not distended. Extremity: no pedal edema PSYCH: alert & oriented x 3 with fluent speech NEURO: no focal motor/sensory deficits   LABORATORY DATA:  I have reviewed the data as listed    Latest Ref Rng & Units 12/21/2023    1:48 PM 11/16/2023   10:14 AM 09/29/2023    8:54 AM  CBC  WBC  4.0 - 10.5 K/uL 6.9  4.9  3.9   Hemoglobin 13.0 - 17.0 g/dL 36.6  44.0  34.7   Hematocrit 39.0 - 52.0 % 51.7  49.4  42.8   Platelets 150 - 400 K/uL 237  183  221        Latest Ref Rng & Units 12/21/2023    1:48 PM 11/16/2023   10:14 AM 09/29/2023    8:54 AM  CMP  Glucose 70 - 99 mg/dL 425  956  387   BUN 6 - 20 mg/dL 14  15  12    Creatinine 0.61 - 1.24 mg/dL 5.64  3.32  9.51   Sodium 135 - 145 mmol/L 139  140  139   Potassium 3.5 - 5.1 mmol/L 3.9  3.7  3.6   Chloride 98 - 111 mmol/L 107  109  106   CO2 22 - 32 mmol/L 25  24  27    Calcium 8.9 - 10.3 mg/dL 9.6  9.6  9.7   Total Protein 6.5 - 8.1 g/dL 7.1  6.6  6.5   Total Bilirubin 0.0 - 1.2 mg/dL 2.0  1.5  0.8   Alkaline Phos 38 - 126 U/L 54  51  44   AST 15 - 41 U/L 22  25  19    ALT 0 - 44 U/L 29  39  24    (Iron and TIBC)  Lab Results  Component Value Date   FERRITIN 39 12/21/2023      RADIOGRAPHIC STUDIES: I have personally reviewed the radiological images as listed and agreed with the findings in the report. No results found.  ASSESSMENT & PLAN  Frank Huerta is a 43 y.o. male who presents for evaluation of hereditary hemochromatosis. Hereditary hemochromatosis is a hereditary condition caused by mutations in the HFE gene, which regulates iron absorption. The most common genes mutated in this condition are the C282Y and H63D genes. Based on outside testing, Frank Huerta has evidence of homozygous mutation of C282Y gene.Homozygous mutations represent a disease state which requires phlebotomy to decrease ferritin levels to a goal of <50  (Blood (2010) 116 (3): 317-325). The goal is to decrease ferritin so there is no deposition in critical organs (liver, heart, pancreas and thyroid ).   #Hereditary Hemochromatosis-homozygous mutation of C282Y gene --Started weekly phlebotomies on 06/14/2023 with goal ferritin <50.  --Outside [place]  liver from 02/05/2023 showed hepatomegaly with fatty liver infiltration. No evidence of iron deposition.   --Echo showed no evidence of iron deposition. EF 65-60%. --Liver biopsy from 07/07/2023. 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     Hemochromatosis Ferritin levels have significantly decreased from 7000 to 102 after 14 weeks of weekly phlebotomies. Liver function tests have normalized. Patient has ceased alcohol consumption.  Low Testosterone  Patient reports low energy and has a testosterone  level of 309, which is on the lower end of the normal range. Patient has previously used testosterone  replacement therapy and is considering it again. -Advise patient to continue with lifestyle modifications including exercise and maintaining a healthy diet. -Reevaluate testosterone  levels in six months, after iron levels have stabilized, before considering testosterone  replacement therapy.  Alcohol Consumption Patient has ceased alcohol consumption which has contributed to improvement in liver function tests. -Encourage continued abstinence from alcohol.  General Health Maintenance -Advise patient to check the iron content of any pre-workout supplements. -Consider potential effects of caffeine and other ingredients in pre-workout supplements on overall health.   PLAN:  -discussed lab results from today, 03/07/24, in detail with patient  FOLLOW-UP: ***  The total time spent in the appointment was *** minutes* .  All of the patient's questions were answered with apparent satisfaction. The patient knows to call the clinic with any problems, questions or concerns.   Jacquelyn Matt MD MS AAHIVMS [facility] Hematology/Oncology Physician [facility]  .*Total Encounter Time as defined by the Centers for Medicare and Medicaid Services includes, in addition to the face-to-face time of a patient visit (documented in the note above) non-face-to-face time: obtaining  and reviewing outside history, ordering and reviewing medications, tests or procedures, care coordination (communications with other health care professionals or caregivers) and documentation in the medical record.    I,Mitra Faeizi,acting as a Neurosurgeon for Jacquelyn Matt, MD.,have documented all relevant documentation on the behalf of Jacquelyn Matt, MD,as directed by  Jacquelyn Matt, MD while in the presence of Jacquelyn Matt, MD.  ***

## 2024-03-08 ENCOUNTER — Other Ambulatory Visit: Payer: Self-pay | Admitting: *Deleted

## 2024-03-08 ENCOUNTER — Inpatient Hospital Stay

## 2024-03-08 ENCOUNTER — Inpatient Hospital Stay: Attending: Physician Assistant

## 2024-03-08 DIAGNOSIS — Z79899 Other long term (current) drug therapy: Secondary | ICD-10-CM | POA: Diagnosis not present

## 2024-03-08 DIAGNOSIS — K760 Fatty (change of) liver, not elsewhere classified: Secondary | ICD-10-CM | POA: Insufficient documentation

## 2024-03-08 DIAGNOSIS — Z79890 Hormone replacement therapy (postmenopausal): Secondary | ICD-10-CM | POA: Insufficient documentation

## 2024-03-08 LAB — FERRITIN: Ferritin: 82 ng/mL (ref 24–336)

## 2024-03-08 LAB — CMP (CANCER CENTER ONLY)
ALT: 38 U/L (ref 0–44)
AST: 26 U/L (ref 15–41)
Albumin: 4.6 g/dL (ref 3.5–5.0)
Alkaline Phosphatase: 49 U/L (ref 38–126)
Anion gap: 5 (ref 5–15)
BUN: 18 mg/dL (ref 6–20)
CO2: 28 mmol/L (ref 22–32)
Calcium: 9.8 mg/dL (ref 8.9–10.3)
Chloride: 106 mmol/L (ref 98–111)
Creatinine: 1.11 mg/dL (ref 0.61–1.24)
GFR, Estimated: >60 mL/min (ref >60)
Glucose, Bld: 85 mg/dL (ref 70–99)
Potassium: 4.5 mmol/L (ref 3.5–5.1)
Sodium: 139 mmol/L (ref 135–145)
Total Bilirubin: 1.6 mg/dL — ABNORMAL HIGH (ref 0.0–1.2)
Total Protein: 6.9 g/dL (ref 6.5–8.1)

## 2024-03-08 LAB — CBC WITH DIFFERENTIAL (CANCER CENTER ONLY)
Abs Immature Granulocytes: 0.1 10*3/uL — ABNORMAL HIGH (ref 0.00–0.07)
Basophils Absolute: 0.1 10*3/uL (ref 0.0–0.1)
Basophils Relative: 1 %
Eosinophils Absolute: 0.1 10*3/uL (ref 0.0–0.5)
Eosinophils Relative: 2 %
HCT: 48.1 % (ref 39.0–52.0)
Hemoglobin: 17 g/dL (ref 13.0–17.0)
Immature Granulocytes: 1 %
Lymphocytes Relative: 22 %
Lymphs Abs: 1.8 10*3/uL (ref 0.7–4.0)
MCH: 31.5 pg (ref 26.0–34.0)
MCHC: 35.3 g/dL (ref 30.0–36.0)
MCV: 89.2 fL (ref 80.0–100.0)
Monocytes Absolute: 0.7 10*3/uL (ref 0.1–1.0)
Monocytes Relative: 9 %
Neutro Abs: 5.5 10*3/uL (ref 1.7–7.7)
Neutrophils Relative %: 65 %
Platelet Count: 200 10*3/uL (ref 150–400)
RBC: 5.39 MIL/uL (ref 4.22–5.81)
RDW: 13.1 % (ref 11.5–15.5)
WBC Count: 8.3 10*3/uL (ref 4.0–10.5)
nRBC: 0 % (ref 0.0–0.2)

## 2024-03-13 DIAGNOSIS — F1020 Alcohol dependence, uncomplicated: Secondary | ICD-10-CM | POA: Diagnosis not present

## 2024-03-14 DIAGNOSIS — F1020 Alcohol dependence, uncomplicated: Secondary | ICD-10-CM | POA: Diagnosis not present

## 2024-03-16 DIAGNOSIS — F1020 Alcohol dependence, uncomplicated: Secondary | ICD-10-CM | POA: Diagnosis not present

## 2024-03-20 DIAGNOSIS — F1020 Alcohol dependence, uncomplicated: Secondary | ICD-10-CM | POA: Diagnosis not present

## 2024-03-21 DIAGNOSIS — F1020 Alcohol dependence, uncomplicated: Secondary | ICD-10-CM | POA: Diagnosis not present

## 2024-03-21 NOTE — Progress Notes (Signed)
 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   HISTORY OF PRESENTING ILLNESS:  Frank Huerta 43 y.o. male returns for a follow up for hereditary hemochromatosis.   Patient was last seen by me on 11/16/2023 and complained of chronic diarrhea.  He reports that his last phlebotomy was 3-4 months ago.  He reports that he continues to have some indigestion issues with belching. Patient has been taking Gas-X, which mildly improved symptoms.   He reports consuming a normal diet.   Patient reports that he has been sober for 68 days without cocaine or alcohol. Patient is also on day 50 of smoking cessation.   Patient reports that he has lost 18 pounds in 2 months, which he attributes to alcohol cessation.   He does engage in yoga/meditation regularly.   He reports that he had shoulder surgery in February.   Patient has stopped testosterone  replacement.   He reports that he generally stays well-hydrated.   Patient denies any back pain.   MEDICAL HISTORY:  Past Medical History:  Diagnosis Date   Attention deficit disorder (ADD)    Cocaine abuse (HCC)    GAD (generalized anxiety disorder)    Hemorrhoids    History of condyloma acuminatum 11/2003   urethral    SURGICAL HISTORY: Past Surgical History:  Procedure Laterality Date   URETHROPLASTY  12/12/2003   @[facility]  by dr s. Isla Mari;   meatotomy,  excision wart distal urethral, cystoscopy, and urethroplasty    SOCIAL HISTORY: Social History   Socioeconomic History   Marital status: Single    Spouse name: Not on file   Number of children: Not on file   Years of education: Not on file   Highest education level: Not  on file  Occupational History   Not on file  Tobacco Use   Smoking status: Every Day    Types: E-cigarettes   Smokeless tobacco: Never  Vaping Use   Vaping status: Every Day  Substance and Sexual Activity   Alcohol use: Yes    Comment: three times weekly   Drug use: Yes    Types: Cocaine   Sexual activity: Not on file  Other Topics Concern   Not on file  Social History Narrative   Not on file   Social Drivers of Health   Financial Resource Strain: Not on file  Food Insecurity: Not on file  Transportation Needs: Not on file  Physical Activity: Not on file  Stress: Not on file  Social Connections: Not on file  Intimate Partner Violence: Not on file    FAMILY HISTORY: No family history on file.  ALLERGIES:  has no known allergies.  MEDICATIONS:  Current Outpatient Medications  Medication Sig Dispense Refill   omeprazole  (PRILOSEC) 20 MG capsule Take 1 capsule (20 mg total) by mouth daily. 30 capsule 1   amphetamine-dextroamphetamine (ADDERALL) 10 MG tablet Take 5 mg by mouth daily as needed.     LORazepam  (ATIVAN ) 0.5 MG tablet Take 1 tablet (0.5 mg total) by mouth as needed for anxiety (before weekly phlebotomy). 30 tablet 0   nicotine (NICODERM CQ - DOSED IN MG/24 HOURS) 14 mg/24hr patch Place 14 mg onto the skin daily.     oxyCODONE -acetaminophen  (PERCOCET) 5-325 MG tablet Take 1  tablet by mouth every 4 (four) hours as needed. (Patient not taking: Reported on 11/16/2023) 15 tablet 0   Peppermint Oil (IBGARD) 90 MG CPCR 2 capsules Orally once a day     valACYclovir  (VALTREX ) 1000 MG tablet Take 1 tablet (1,000 mg total) by mouth 2 (two) times daily. 10 tablet 2   No current facility-administered medications for this visit.    REVIEW OF SYSTEMS:   .10 Point review of Systems was done is negative except as noted above.   PHYSICAL EXAMINATION: ECOG PERFORMANCE STATUS: 0 - Asymptomatic  Vitals:   03/22/24 1104  BP: 131/78  Pulse: 73  Resp: 20  Temp: 98.1 F  (36.7 C)  SpO2: 96%   Filed Weights   03/22/24 1104  Weight: 209 lb 11.2 oz (95.1 kg)  GENERAL:alert, in no acute distress and comfortable SKIN: no acute rashes, no significant lesions EYES: conjunctiva are pink and non-injected, sclera anicteric OROPHARYNX: MMM, no exudates, no oropharyngeal erythema or ulceration NECK: supple, no JVD LYMPH:  no palpable lymphadenopathy in the cervical, axillary or inguinal regions LUNGS: clear to auscultation b/l with normal respiratory effort HEART: regular rate & rhythm ABDOMEN:  normoactive bowel sounds , non tender, not distended.  No hepatosplenomegaly Extremity: no pedal edema PSYCH: alert & oriented x 3 with fluent speech NEURO: no focal motor/sensory deficits   LABORATORY DATA:  I have reviewed the data as listed     Latest Ref Rng & Units 03/08/2024   10:24 AM 12/21/2023    1:48 PM 11/16/2023   10:14 AM  CBC  WBC 4.0 - 10.5 K/uL 8.3  6.9  4.9   Hemoglobin 13.0 - 17.0 g/dL 29.5  28.4  13.2   Hematocrit 39.0 - 52.0 % 48.1  51.7  49.4   Platelets 150 - 400 K/uL 200  237  183        Latest Ref Rng & Units 03/08/2024   10:24 AM 12/21/2023    1:48 PM 11/16/2023   10:14 AM  CMP  Glucose 70 - 99 mg/dL 85  440  102   BUN 6 - 20 mg/dL 18  14  15    Creatinine 0.61 - 1.24 mg/dL 7.25  3.66  4.40   Sodium 135 - 145 mmol/L 139  139  140   Potassium 3.5 - 5.1 mmol/L 4.5  3.9  3.7   Chloride 98 - 111 mmol/L 106  107  109   CO2 22 - 32 mmol/L 28  25  24    Calcium 8.9 - 10.3 mg/dL 9.8  9.6  9.6   Total Protein 6.5 - 8.1 g/dL 6.9  7.1  6.6   Total Bilirubin 0.0 - 1.2 mg/dL 1.6  2.0  1.5   Alkaline Phos 38 - 126 U/L 49  54  51   AST 15 - 41 U/L 26  22  25    ALT 0 - 44 U/L 38  29  39    (Iron and TIBC)  Lab Results  Component Value Date   FERRITIN 82 03/08/2024      RADIOGRAPHIC STUDIES: I have personally reviewed the radiological images as listed and agreed with the findings in the report. No results found.  ASSESSMENT &  PLAN  Frank Huerta is a 43 y.o. male who presents for evaluation of hereditary hemochromatosis. Hereditary hemochromatosis is a hereditary condition caused by mutations in the HFE gene, which regulates iron absorption. The most common genes mutated in this condition are the C282Y and H63D  genes. Based on outside testing, Frank Huerta has evidence of homozygous mutation of C282Y gene.Homozygous mutations represent a disease state which requires phlebotomy to decrease ferritin levels to a goal of <50  (Blood (2010) 116 (3): 317-325). The goal is to decrease ferritin so there is no deposition in critical organs (liver, heart, pancreas and thyroid ).   #Hereditary Hemochromatosis-homozygous mutation of C282Y gene --Started weekly phlebotomies on 06/14/2023 with goal ferritin <50.  --Outside [place]  liver from 02/05/2023 showed hepatomegaly with fatty liver infiltration. No evidence of iron deposition.  --Echo showed no evidence of iron deposition. EF 65-60%. --Liver biopsy from 07/07/2023. 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     Hemochromatosis Ferritin levels have significantly decreased from 7000 to 102 after 14 weeks of weekly phlebotomies. Liver function tests have normalized. Patient has ceased alcohol consumption.  Low Testosterone  Patient reports low energy and has a testosterone  level of 309, which is on the lower end of the normal range. Patient has previously used testosterone  replacement therapy and is now off this-Advise patient to continue with lifestyle modifications including exercise and maintaining a healthy diet. -Reevaluate testosterone  levels in six months, after iron levels have stabilized, before considering testosterone  replacement therapy.  Alcohol Consumption Patient has ceased alcohol consumption which has contributed to improvement in liver function  tests. -Encourage continued abstinence from alcohol.  He has gone through rehabilitation program and is motivated to stay off the alcohol.  General Health Maintenance -Advise patient to check the iron content of any pre-workout supplements. -Consider potential effects of caffeine and other ingredients in pre-workout supplements on overall health.   PLAN:  -Discussed lab results from 03/08/2024 in detail with patient. CBC showed WBC of 8.3K, hemoglobin of 17, HCT 48.1%, and platelets of 200K. -liver enzymes normalized  -total bilirubin very borderline at 1.6 mg/dL and is improving. Discussed that bilirubin can sometimes can be higher with fasting -iron is not increasing too quickly -ferritin 82 two weeks ago -discussed ferritin goal under 100 -happy to hear that patient has quit smoking and alcohol consumption -discussed that alcohol cessation would contribute to less iron absorption and less irritation to the liver -discussed that alcohol was likely primarily bothering the liver, and iron at less of an extent -discussed that he may need less phlebotomies depending on his diet and his alcohol cessation -discussed that there is a chance that he may only need maintenance phlebotomies every 6-12 months -recommend taking OTC Prilosec daily for two weeks and then as needed -will plan for a phone visit with labs in 3-4 months. If his labs are stable at that time, we can consider spacing out visits -answered all of patient's questions in detail  FOLLOW-UP: Phone visit with Dr Salomon Cree in 3 months Labs 1-2 days prior Cancel therapeutic phlebotomy appointments for now.  The total time spent in the appointment was 30 minutes* .  All of the patient's questions were answered with apparent satisfaction. The patient knows to call the clinic with any problems, questions or concerns.   Jacquelyn Matt MD MS AAHIVMS [facility] Hematology/Oncology Physician [facility]  .*Total Encounter Time as  defined by the Centers for Medicare and Medicaid Services includes, in addition to the face-to-face time of a patient visit (documented in the note above) non-face-to-face time: obtaining and reviewing outside history, ordering and reviewing medications, tests or procedures, care coordination (communications with other health  care professionals or caregivers) and documentation in the medical record.    I,Mitra Faeizi,acting as a Neurosurgeon for Jacquelyn Matt, MD.,have documented all relevant documentation on the behalf of Jacquelyn Matt, MD,as directed by  Jacquelyn Matt, MD while in the presence of Jacquelyn Matt, MD.  .I have reviewed the above documentation for accuracy and completeness, and I agree with the above. .Lametria Klunk Kishore Arkie Tagliaferro MD

## 2024-03-22 ENCOUNTER — Inpatient Hospital Stay: Attending: Physician Assistant

## 2024-03-22 ENCOUNTER — Inpatient Hospital Stay: Admitting: Hematology

## 2024-03-22 DIAGNOSIS — Z79899 Other long term (current) drug therapy: Secondary | ICD-10-CM | POA: Diagnosis not present

## 2024-03-22 DIAGNOSIS — Z79890 Hormone replacement therapy (postmenopausal): Secondary | ICD-10-CM | POA: Diagnosis not present

## 2024-03-22 DIAGNOSIS — K760 Fatty (change of) liver, not elsewhere classified: Secondary | ICD-10-CM | POA: Diagnosis not present

## 2024-03-22 MED ORDER — OMEPRAZOLE 20 MG PO CPDR: 20.0000 mg | DELAYED_RELEASE_CAPSULE | Freq: Every day | ORAL | 1 refills | Status: DC

## 2024-03-23 DIAGNOSIS — F1020 Alcohol dependence, uncomplicated: Secondary | ICD-10-CM | POA: Diagnosis not present

## 2024-03-27 ENCOUNTER — Encounter: Payer: Self-pay | Admitting: Physician Assistant

## 2024-03-27 DIAGNOSIS — F1020 Alcohol dependence, uncomplicated: Secondary | ICD-10-CM | POA: Diagnosis not present

## 2024-03-28 DIAGNOSIS — F1020 Alcohol dependence, uncomplicated: Secondary | ICD-10-CM | POA: Diagnosis not present

## 2024-03-30 DIAGNOSIS — F1020 Alcohol dependence, uncomplicated: Secondary | ICD-10-CM | POA: Diagnosis not present

## 2024-04-03 DIAGNOSIS — F1020 Alcohol dependence, uncomplicated: Secondary | ICD-10-CM | POA: Diagnosis not present

## 2024-04-04 DIAGNOSIS — F1020 Alcohol dependence, uncomplicated: Secondary | ICD-10-CM | POA: Diagnosis not present

## 2024-04-06 DIAGNOSIS — F1020 Alcohol dependence, uncomplicated: Secondary | ICD-10-CM | POA: Diagnosis not present

## 2024-04-10 DIAGNOSIS — F1020 Alcohol dependence, uncomplicated: Secondary | ICD-10-CM | POA: Diagnosis not present

## 2024-04-11 DIAGNOSIS — F1020 Alcohol dependence, uncomplicated: Secondary | ICD-10-CM | POA: Diagnosis not present

## 2024-04-13 DIAGNOSIS — F1020 Alcohol dependence, uncomplicated: Secondary | ICD-10-CM | POA: Diagnosis not present

## 2024-04-17 ENCOUNTER — Encounter: Payer: Self-pay | Admitting: Physician Assistant

## 2024-05-11 DIAGNOSIS — Z0000 Encounter for general adult medical examination without abnormal findings: Secondary | ICD-10-CM | POA: Diagnosis not present

## 2024-05-11 DIAGNOSIS — N529 Male erectile dysfunction, unspecified: Secondary | ICD-10-CM | POA: Diagnosis not present

## 2024-05-11 DIAGNOSIS — I10 Essential (primary) hypertension: Secondary | ICD-10-CM | POA: Diagnosis not present

## 2024-05-11 DIAGNOSIS — R5383 Other fatigue: Secondary | ICD-10-CM | POA: Diagnosis not present

## 2024-05-11 DIAGNOSIS — G4733 Obstructive sleep apnea (adult) (pediatric): Secondary | ICD-10-CM | POA: Diagnosis not present

## 2024-05-30 DIAGNOSIS — F1020 Alcohol dependence, uncomplicated: Secondary | ICD-10-CM | POA: Diagnosis not present

## 2024-05-30 DIAGNOSIS — F1010 Alcohol abuse, uncomplicated: Secondary | ICD-10-CM | POA: Diagnosis not present

## 2024-05-30 DIAGNOSIS — F411 Generalized anxiety disorder: Secondary | ICD-10-CM | POA: Diagnosis not present

## 2024-05-31 DIAGNOSIS — L814 Other melanin hyperpigmentation: Secondary | ICD-10-CM | POA: Diagnosis not present

## 2024-05-31 DIAGNOSIS — D225 Melanocytic nevi of trunk: Secondary | ICD-10-CM | POA: Diagnosis not present

## 2024-05-31 DIAGNOSIS — L821 Other seborrheic keratosis: Secondary | ICD-10-CM | POA: Diagnosis not present

## 2024-05-31 DIAGNOSIS — L739 Follicular disorder, unspecified: Secondary | ICD-10-CM | POA: Diagnosis not present

## 2024-06-08 DIAGNOSIS — E781 Pure hyperglyceridemia: Secondary | ICD-10-CM | POA: Diagnosis not present

## 2024-06-13 DIAGNOSIS — F411 Generalized anxiety disorder: Secondary | ICD-10-CM | POA: Diagnosis not present

## 2024-06-13 DIAGNOSIS — F1010 Alcohol abuse, uncomplicated: Secondary | ICD-10-CM | POA: Diagnosis not present

## 2024-06-21 ENCOUNTER — Inpatient Hospital Stay: Attending: Hematology

## 2024-06-21 DIAGNOSIS — F17290 Nicotine dependence, other tobacco product, uncomplicated: Secondary | ICD-10-CM | POA: Insufficient documentation

## 2024-06-21 DIAGNOSIS — Z79624 Long term (current) use of inhibitors of nucleotide synthesis: Secondary | ICD-10-CM | POA: Insufficient documentation

## 2024-06-21 DIAGNOSIS — Z79899 Other long term (current) drug therapy: Secondary | ICD-10-CM | POA: Insufficient documentation

## 2024-06-21 LAB — CMP (CANCER CENTER ONLY)
ALT: 38 U/L (ref 0–44)
AST: 28 U/L (ref 15–41)
Albumin: 4.6 g/dL (ref 3.5–5.0)
Alkaline Phosphatase: 58 U/L (ref 38–126)
Anion gap: 7 (ref 5–15)
BUN: 18 mg/dL (ref 6–20)
CO2: 24 mmol/L (ref 22–32)
Calcium: 10 mg/dL (ref 8.9–10.3)
Chloride: 107 mmol/L (ref 98–111)
Creatinine: 1.1 mg/dL (ref 0.61–1.24)
GFR, Estimated: >60 mL/min (ref >60)
Glucose, Bld: 97 mg/dL (ref 70–99)
Potassium: 4.1 mmol/L (ref 3.5–5.1)
Sodium: 138 mmol/L (ref 135–145)
Total Bilirubin: 1.7 mg/dL — ABNORMAL HIGH (ref 0.0–1.2)
Total Protein: 7 g/dL (ref 6.5–8.1)

## 2024-06-21 LAB — CBC WITH DIFFERENTIAL (CANCER CENTER ONLY)
Abs Immature Granulocytes: 0.01 K/uL (ref 0.00–0.07)
Basophils Absolute: 0 K/uL (ref 0.0–0.1)
Basophils Relative: 1 %
Eosinophils Absolute: 0.2 K/uL (ref 0.0–0.5)
Eosinophils Relative: 3 %
HCT: 51.4 % (ref 39.0–52.0)
Hemoglobin: 18.7 g/dL — ABNORMAL HIGH (ref 13.0–17.0)
Immature Granulocytes: 0 %
Lymphocytes Relative: 39 %
Lymphs Abs: 2.2 K/uL (ref 0.7–4.0)
MCH: 31.3 pg (ref 26.0–34.0)
MCHC: 36.4 g/dL — ABNORMAL HIGH (ref 30.0–36.0)
MCV: 86 fL (ref 80.0–100.0)
Monocytes Absolute: 0.4 K/uL (ref 0.1–1.0)
Monocytes Relative: 7 %
Neutro Abs: 2.8 K/uL (ref 1.7–7.7)
Neutrophils Relative %: 50 %
Platelet Count: 158 K/uL (ref 150–400)
RBC: 5.98 MIL/uL — ABNORMAL HIGH (ref 4.22–5.81)
RDW: 11.9 % (ref 11.5–15.5)
WBC Count: 5.7 K/uL (ref 4.0–10.5)
nRBC: 0 % (ref 0.0–0.2)

## 2024-06-21 LAB — IRON AND IRON BINDING CAPACITY (CC-WL,HP ONLY)
Iron: 185 ug/dL — ABNORMAL HIGH (ref 45–182)
Saturation Ratios: 65 % — ABNORMAL HIGH (ref 17.9–39.5)
TIBC: 286 ug/dL (ref 250–450)
UIBC: 101 ug/dL — ABNORMAL LOW (ref 117–376)

## 2024-06-21 LAB — FERRITIN: Ferritin: 123 ng/mL (ref 24–336)

## 2024-06-23 ENCOUNTER — Inpatient Hospital Stay: Admitting: Hematology

## 2024-06-23 DIAGNOSIS — Z79899 Other long term (current) drug therapy: Secondary | ICD-10-CM | POA: Diagnosis not present

## 2024-06-23 DIAGNOSIS — Z79624 Long term (current) use of inhibitors of nucleotide synthesis: Secondary | ICD-10-CM | POA: Diagnosis not present

## 2024-06-23 DIAGNOSIS — F17290 Nicotine dependence, other tobacco product, uncomplicated: Secondary | ICD-10-CM | POA: Diagnosis not present

## 2024-06-23 DIAGNOSIS — D751 Secondary polycythemia: Secondary | ICD-10-CM

## 2024-06-29 ENCOUNTER — Encounter: Payer: Self-pay | Admitting: Physician Assistant

## 2024-06-29 NOTE — Progress Notes (Signed)
 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   HISTORY OF PRESENTING ILLNESS:   Frank Huerta 43 y.o. male who is here for continued valuation and management of his elevated hemochromatosis. He notes that he has been sober for nearly 6 months now which is an important milestone for him.  He has been off all alcohol  other recreational drugs as well as his Adderall. He notes that he has gone back to testosterone  replacement. Notes that he has been feeling well overall. No fevers no chills no night sweats. Labs done today were discussed with him in details.  MEDICAL HISTORY:  Past Medical History:  Diagnosis Date   Attention deficit disorder (ADD)    Cocaine abuse (HCC)    GAD (generalized anxiety disorder)    Hemorrhoids    History of condyloma acuminatum 11/2003   urethral    SURGICAL HISTORY: Past Surgical History:  Procedure Laterality Date   URETHROPLASTY  12/12/2003   @[facility]  by dr s. chales;   meatotomy,  excision wart distal urethral, cystoscopy, and urethroplasty    SOCIAL HISTORY: Social History   Socioeconomic History   Marital status: Single    Spouse name: Not on file   Number of children: Not on file   Years of education: Not on file   Highest education level: Not on file  Occupational History   Not on file  Tobacco Use   Smoking status: Every Day    Types: E-cigarettes   Smokeless tobacco: Never  Vaping Use   Vaping status: Every Day  Substance and Sexual Activity   Alcohol  use: Yes    Comment: three times weekly   Drug use: Yes    Types: Cocaine   Sexual activity: Not on file  Other Topics Concern   Not on file  Social History  Narrative   Not on file   Social Drivers of Health   Financial Resource Strain: Not on file  Food Insecurity: Not on file  Transportation Needs: Not on file  Physical Activity: Not on file  Stress: Not on file  Social Connections: Not on file  Intimate Partner Violence: Not on file    FAMILY HISTORY: No family history on file.  ALLERGIES:  has no known allergies.  MEDICATIONS:  Current Outpatient Medications  Medication Sig Dispense Refill   amphetamine-dextroamphetamine (ADDERALL) 10 MG tablet Take 5 mg by mouth daily as needed.     LORazepam  (ATIVAN ) 0.5 MG tablet Take 1 tablet (0.5 mg total) by mouth as needed for anxiety (before weekly phlebotomy). 30 tablet 0   nicotine (NICODERM CQ - DOSED IN MG/24 HOURS) 14 mg/24hr patch Place 14 mg onto the skin daily.     omeprazole  (PRILOSEC) 20 MG capsule Take 1 capsule (20 mg total) by mouth daily. 30 capsule 1   oxyCODONE -acetaminophen  (PERCOCET) 5-325 MG tablet Take 1 tablet by mouth every 4 (four) hours as needed. (Patient not taking: Reported on 11/16/2023) 15 tablet 0   Peppermint Oil (IBGARD) 90 MG CPCR 2 capsules Orally once a day     valACYclovir  (VALTREX ) 1000 MG tablet Take 1 tablet (1,000 mg total) by mouth 2 (two) times daily. 10 tablet 2   No current facility-administered medications for this visit.    REVIEW OF  SYSTEMS:   .10 Point review of Systems was done is negative except as noted above.  PHYSICAL EXAMINATION: ECOG PERFORMANCE STATUS: 0 - Asymptomatic  Vitals:   06/23/24 1441  BP: 119/82  Pulse: 72  Resp: 20  Temp: 98.1 F (36.7 C)  SpO2: 96%   Filed Weights   06/23/24 1441  Weight: 217 lb 8 oz (98.7 kg)  . GENERAL:alert, in no acute distress and comfortable SKIN: no acute rashes, no significant lesions EYES: conjunctiva are pink and non-injected, sclera anicteric OROPHARYNX: MMM, no exudates, no oropharyngeal erythema or ulceration NECK: supple, no JVD LYMPH:  no palpable lymphadenopathy in the  cervical, axillary or inguinal regions LUNGS: clear to auscultation b/l with normal respiratory effort HEART: regular rate & rhythm ABDOMEN:  normoactive bowel sounds , non tender, not distended. Extremity: no pedal edema PSYCH: alert & oriented x 3 with fluent speech NEURO: no focal motor/sensory deficits   LABORATORY DATA:  I have reviewed the data as listed     Latest Ref Rng & Units 06/21/2024    2:20 PM 03/08/2024   10:24 AM 12/21/2023    1:48 PM  CBC  WBC 4.0 - 10.5 K/uL 5.7  8.3  6.9   Hemoglobin 13.0 - 17.0 g/dL 81.2  82.9  81.8   Hematocrit 39.0 - 52.0 % 51.4  48.1  51.7   Platelets 150 - 400 K/uL 158  200  237        Latest Ref Rng & Units 06/21/2024    2:20 PM 03/08/2024   10:24 AM 12/21/2023    1:48 PM  CMP  Glucose 70 - 99 mg/dL 97  85  877   BUN 6 - 20 mg/dL 18  18  14    Creatinine 0.61 - 1.24 mg/dL 8.89  8.88  8.85   Sodium 135 - 145 mmol/L 138  139  139   Potassium 3.5 - 5.1 mmol/L 4.1  4.5  3.9   Chloride 98 - 111 mmol/L 107  106  107   CO2 22 - 32 mmol/L 24  28  25    Calcium 8.9 - 10.3 mg/dL 89.9  9.8  9.6   Total Protein 6.5 - 8.1 g/dL 7.0  6.9  7.1   Total Bilirubin 0.0 - 1.2 mg/dL 1.7  1.6  2.0   Alkaline Phos 38 - 126 U/L 58  49  54   AST 15 - 41 U/L 28  26  22    ALT 0 - 44 U/L 38  38  29    . Lab Results  Component Value Date   IRON 185 (H) 06/21/2024   TIBC 286 06/21/2024   IRONPCTSAT 65 (H) 06/21/2024   (Iron and TIBC)  Lab Results  Component Value Date   FERRITIN 123 06/21/2024        RADIOGRAPHIC STUDIES: I have personally reviewed the radiological images as listed and agreed with the findings in the report. No results found.  ASSESSMENT & PLAN  DEWAIN PLATZ is a 43 y.o. male who presents for evaluation of hereditary hemochromatosis. Hereditary hemochromatosis is a hereditary condition caused by mutations in the HFE gene, which regulates iron absorption. The most common genes mutated in this condition are the C282Y and H63D  genes. Based on outside testing, Mr. Bair has evidence of homozygous mutation of C282Y gene.Homozygous mutations represent a disease state which requires phlebotomy to decrease ferritin levels to a goal of <50  (Blood (2010) 116 (3): 317-325). The goal is to decrease  ferritin so there is no deposition in critical organs (liver, heart, pancreas and thyroid ).   #Hereditary Hemochromatosis-homozygous mutation of C282Y gene --Started weekly phlebotomies on 06/14/2023 with goal ferritin <50.  --Outside [place]  liver from 02/05/2023 showed hepatomegaly with fatty liver infiltration. No evidence of iron deposition.  --Echo showed no evidence of iron deposition. EF 65-60%. --Liver biopsy from 07/07/2023. 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     Hemochromatosis Ferritin levels have significantly decreased from 7000 to 102 after 14 weeks of weekly phlebotomies. Liver function tests have normalized. Patient has ceased alcohol  consumption.  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 -Encourage continued abstinence from alcohol .  He has gone through rehabilitation program and is motivated to stay off the alcohol .  Patient has reached 6 months of sobriety  General Health Maintenance -Advise patient to check the iron content of any pre-workout supplements. -Consider potential effects of caffeine and other ingredients in pre-workout supplements on overall health.   PLAN:  - Patient labs from 06/21/2024 were discussed in detail with him CBC shows secondary polycythemia due to testosterone  use with a hemoglobin of 18.7 and hematocrit of 51.4.  White blood counts and platelets are within normal limits CMP  stable normal AST ALT and alkaline phosphatase, borderline elevated bilirubin of 1.7 Ferritin of 123 with an iron saturation of 65% Mr. Kreuzer has been sober for more than 6 months from his alcohol  and was congratulated and counseled to keep maintaining his sobriety. Will set him up for orthopedic about him in 1 to 2 weeks to get a ferritin of less than 100 and iron saturation of less than 60% He will need to discuss with his PCP/urologist to use the lowest effective dose of testosterone  to avoid excessive secondary polycythemia and risk of arterial and venous thrombosis. He was recommended to drink at least 2 L of water daily Continue absolute alcohol  cessation and smoking cessation. Will check JAK2 mutation profile with next labs  FOLLOW-UP:  Therapeutic phlebotomy in 2 weeks RTC with Dr Onesimo in 14 weeks with appointment for therapeutic phlebotomy. Labs 1-2 days prior to this appointment.   The total time spent in the appointment was 32 minutes*.  All of the patient's questions were answered with apparent satisfaction. The patient knows to call the clinic with any problems, questions or concerns.   Emaline Onesimo MD MS AAHIVMS [facility] Hematology/Oncology Physician [facility]  .*Total Encounter Time as defined by the Centers for Medicare and Medicaid Services includes, in addition to the face-to-face time of a patient visit (documented in the note above) non-face-to-face time: obtaining and reviewing outside history, ordering and reviewing medications, tests or procedures, care coordination (communications with other health care professionals or caregivers) and documentation in the medical record.

## 2024-06-30 DIAGNOSIS — M7502 Adhesive capsulitis of left shoulder: Secondary | ICD-10-CM | POA: Diagnosis not present

## 2024-07-03 DIAGNOSIS — F1020 Alcohol dependence, uncomplicated: Secondary | ICD-10-CM | POA: Diagnosis not present

## 2024-07-04 ENCOUNTER — Telehealth: Payer: Self-pay | Admitting: Hematology

## 2024-07-04 NOTE — Telephone Encounter (Signed)
 Rescheduled appointments per incoming call from the patient. Talked with the patient and he is aware of the changes made to his upcoming appointments.

## 2024-07-07 ENCOUNTER — Inpatient Hospital Stay

## 2024-07-07 DIAGNOSIS — M6281 Muscle weakness (generalized): Secondary | ICD-10-CM | POA: Diagnosis not present

## 2024-07-07 DIAGNOSIS — M25612 Stiffness of left shoulder, not elsewhere classified: Secondary | ICD-10-CM | POA: Diagnosis not present

## 2024-07-10 DIAGNOSIS — M6281 Muscle weakness (generalized): Secondary | ICD-10-CM | POA: Diagnosis not present

## 2024-07-10 DIAGNOSIS — M25612 Stiffness of left shoulder, not elsewhere classified: Secondary | ICD-10-CM | POA: Diagnosis not present

## 2024-07-12 DIAGNOSIS — F1010 Alcohol abuse, uncomplicated: Secondary | ICD-10-CM | POA: Diagnosis not present

## 2024-07-12 DIAGNOSIS — F411 Generalized anxiety disorder: Secondary | ICD-10-CM | POA: Diagnosis not present

## 2024-07-18 ENCOUNTER — Other Ambulatory Visit: Payer: Self-pay

## 2024-07-19 ENCOUNTER — Inpatient Hospital Stay

## 2024-07-19 ENCOUNTER — Inpatient Hospital Stay: Attending: Hematology

## 2024-07-19 LAB — CMP (CANCER CENTER ONLY)
ALT: 32 U/L (ref 0–44)
AST: 25 U/L (ref 15–41)
Albumin: 4.6 g/dL (ref 3.5–5.0)
Alkaline Phosphatase: 58 U/L (ref 38–126)
Anion gap: 6 (ref 5–15)
BUN: 19 mg/dL (ref 6–20)
CO2: 25 mmol/L (ref 22–32)
Calcium: 9.5 mg/dL (ref 8.9–10.3)
Chloride: 108 mmol/L (ref 98–111)
Creatinine: 1.09 mg/dL (ref 0.61–1.24)
GFR, Estimated: >60 mL/min (ref >60)
Glucose, Bld: 98 mg/dL (ref 70–99)
Potassium: 4.1 mmol/L (ref 3.5–5.1)
Sodium: 139 mmol/L (ref 135–145)
Total Bilirubin: 2.2 mg/dL — ABNORMAL HIGH (ref 0.0–1.2)
Total Protein: 7 g/dL (ref 6.5–8.1)

## 2024-07-19 LAB — IRON AND IRON BINDING CAPACITY (CC-WL,HP ONLY)
Iron: 205 ug/dL — ABNORMAL HIGH (ref 45–182)
Saturation Ratios: 76 % — ABNORMAL HIGH (ref 17.9–39.5)
TIBC: 272 ug/dL (ref 250–450)
UIBC: 67 ug/dL — ABNORMAL LOW (ref 117–376)

## 2024-07-19 LAB — CBC WITH DIFFERENTIAL (CANCER CENTER ONLY)
Abs Immature Granulocytes: 0.01 K/uL (ref 0.00–0.07)
Basophils Absolute: 0 K/uL (ref 0.0–0.1)
Basophils Relative: 1 %
Eosinophils Absolute: 0.2 K/uL (ref 0.0–0.5)
Eosinophils Relative: 3 %
HCT: 47.9 % (ref 39.0–52.0)
Hemoglobin: 17.5 g/dL — ABNORMAL HIGH (ref 13.0–17.0)
Immature Granulocytes: 0 %
Lymphocytes Relative: 34 %
Lymphs Abs: 2.1 K/uL (ref 0.7–4.0)
MCH: 31.2 pg (ref 26.0–34.0)
MCHC: 36.5 g/dL — ABNORMAL HIGH (ref 30.0–36.0)
MCV: 85.4 fL (ref 80.0–100.0)
Monocytes Absolute: 0.4 K/uL (ref 0.1–1.0)
Monocytes Relative: 6 %
Neutro Abs: 3.4 K/uL (ref 1.7–7.7)
Neutrophils Relative %: 56 %
Platelet Count: 161 K/uL (ref 150–400)
RBC: 5.61 MIL/uL (ref 4.22–5.81)
RDW: 12.4 % (ref 11.5–15.5)
WBC Count: 6.1 K/uL (ref 4.0–10.5)
nRBC: 0 % (ref 0.0–0.2)

## 2024-07-19 LAB — FERRITIN: Ferritin: 147 ng/mL (ref 24–336)

## 2024-07-19 NOTE — Patient Instructions (Signed)

## 2024-07-19 NOTE — Progress Notes (Signed)
 Frank Huerta presents today for phlebotomy per MD orders. Phlebotomy procedure started at 1425 and ended at 1442. 507 grams removed. Patient observed for 30 minutes after procedure without any incident. Patient tolerated procedure well. IV needle removed intact.

## 2024-07-20 DIAGNOSIS — M6281 Muscle weakness (generalized): Secondary | ICD-10-CM | POA: Diagnosis not present

## 2024-07-20 DIAGNOSIS — M25612 Stiffness of left shoulder, not elsewhere classified: Secondary | ICD-10-CM | POA: Diagnosis not present

## 2024-07-21 ENCOUNTER — Inpatient Hospital Stay

## 2024-07-24 DIAGNOSIS — F1020 Alcohol dependence, uncomplicated: Secondary | ICD-10-CM | POA: Diagnosis not present

## 2024-07-26 DIAGNOSIS — M25612 Stiffness of left shoulder, not elsewhere classified: Secondary | ICD-10-CM | POA: Diagnosis not present

## 2024-07-26 DIAGNOSIS — M6281 Muscle weakness (generalized): Secondary | ICD-10-CM | POA: Diagnosis not present

## 2024-08-01 DIAGNOSIS — F411 Generalized anxiety disorder: Secondary | ICD-10-CM | POA: Diagnosis not present

## 2024-08-01 DIAGNOSIS — F1010 Alcohol abuse, uncomplicated: Secondary | ICD-10-CM | POA: Diagnosis not present

## 2024-08-21 DIAGNOSIS — F1020 Alcohol dependence, uncomplicated: Secondary | ICD-10-CM | POA: Diagnosis not present

## 2024-08-24 DIAGNOSIS — F1020 Alcohol dependence, uncomplicated: Secondary | ICD-10-CM | POA: Diagnosis not present

## 2024-09-06 DIAGNOSIS — F1010 Alcohol abuse, uncomplicated: Secondary | ICD-10-CM | POA: Diagnosis not present

## 2024-09-06 DIAGNOSIS — F1020 Alcohol dependence, uncomplicated: Secondary | ICD-10-CM | POA: Diagnosis not present

## 2024-09-06 DIAGNOSIS — F411 Generalized anxiety disorder: Secondary | ICD-10-CM | POA: Diagnosis not present

## 2024-09-18 DIAGNOSIS — F1020 Alcohol dependence, uncomplicated: Secondary | ICD-10-CM | POA: Diagnosis not present

## 2024-09-26 DIAGNOSIS — F1010 Alcohol abuse, uncomplicated: Secondary | ICD-10-CM | POA: Diagnosis not present

## 2024-09-27 DIAGNOSIS — F1020 Alcohol dependence, uncomplicated: Secondary | ICD-10-CM | POA: Diagnosis not present

## 2024-10-04 ENCOUNTER — Inpatient Hospital Stay: Attending: Hematology

## 2024-10-04 ENCOUNTER — Other Ambulatory Visit: Payer: Self-pay

## 2024-10-04 DIAGNOSIS — D751 Secondary polycythemia: Secondary | ICD-10-CM

## 2024-10-04 DIAGNOSIS — Z79899 Other long term (current) drug therapy: Secondary | ICD-10-CM | POA: Diagnosis not present

## 2024-10-04 LAB — IRON AND IRON BINDING CAPACITY (CC-WL,HP ONLY)
Iron: 129 ug/dL (ref 45–182)
Saturation Ratios: 40 % — ABNORMAL HIGH (ref 17.9–39.5)
TIBC: 322 ug/dL (ref 250–450)
UIBC: 193 ug/dL

## 2024-10-04 LAB — CBC WITH DIFFERENTIAL (CANCER CENTER ONLY)
Abs Immature Granulocytes: 0.01 K/uL (ref 0.00–0.07)
Basophils Absolute: 0 K/uL (ref 0.0–0.1)
Basophils Relative: 1 %
Eosinophils Absolute: 0.1 K/uL (ref 0.0–0.5)
Eosinophils Relative: 2 %
HCT: 51.7 % (ref 39.0–52.0)
Hemoglobin: 18.7 g/dL — ABNORMAL HIGH (ref 13.0–17.0)
Immature Granulocytes: 0 %
Lymphocytes Relative: 31 %
Lymphs Abs: 2.2 K/uL (ref 0.7–4.0)
MCH: 31.9 pg (ref 26.0–34.0)
MCHC: 36.2 g/dL — ABNORMAL HIGH (ref 30.0–36.0)
MCV: 88.2 fL (ref 80.0–100.0)
Monocytes Absolute: 0.6 K/uL (ref 0.1–1.0)
Monocytes Relative: 9 %
Neutro Abs: 4 K/uL (ref 1.7–7.7)
Neutrophils Relative %: 57 %
Platelet Count: 191 K/uL (ref 150–400)
RBC: 5.86 MIL/uL — ABNORMAL HIGH (ref 4.22–5.81)
RDW: 12.6 % (ref 11.5–15.5)
WBC Count: 7 K/uL (ref 4.0–10.5)
nRBC: 0 % (ref 0.0–0.2)

## 2024-10-04 LAB — CMP (CANCER CENTER ONLY)
ALT: 33 U/L (ref 0–44)
AST: 30 U/L (ref 15–41)
Albumin: 4.5 g/dL (ref 3.5–5.0)
Alkaline Phosphatase: 54 U/L (ref 38–126)
Anion gap: 10 (ref 5–15)
BUN: 16 mg/dL (ref 6–20)
CO2: 24 mmol/L (ref 22–32)
Calcium: 9.6 mg/dL (ref 8.9–10.3)
Chloride: 104 mmol/L (ref 98–111)
Creatinine: 1.37 mg/dL — ABNORMAL HIGH (ref 0.61–1.24)
GFR, Estimated: >60 mL/min (ref >60)
Glucose, Bld: 126 mg/dL — ABNORMAL HIGH (ref 70–99)
Potassium: 3.9 mmol/L (ref 3.5–5.1)
Sodium: 138 mmol/L (ref 135–145)
Total Bilirubin: 2.8 mg/dL — ABNORMAL HIGH (ref 0.0–1.2)
Total Protein: 6.8 g/dL (ref 6.5–8.1)

## 2024-10-04 LAB — FERRITIN: Ferritin: 36 ng/mL (ref 24–336)

## 2024-10-06 ENCOUNTER — Inpatient Hospital Stay

## 2024-10-06 ENCOUNTER — Inpatient Hospital Stay: Admitting: Hematology

## 2024-10-06 DIAGNOSIS — D751 Secondary polycythemia: Secondary | ICD-10-CM | POA: Diagnosis not present

## 2024-10-06 NOTE — Progress Notes (Signed)
 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   HISTORY OF PRESENTING ILLNESS:   (11/16/2023) Frank Huerta 43 y.o. male returns for a follow up for hereditary hemochromatosis.    Patient was last seen by me on 09/29/2023 and he complained of lethargy. He wanted to change his weekly phlebotomies to bi-weekly. Patient reported consuming alcohol , red meat, and seafood.    Patient notes he has been doing well overall since our last visit. Patient notes he did consume seafood since our last visit. He denies any new infection issues, fever, chills, night sweats, unexpected weight loss, chest pain, SOB, back pain, abdominal pain, or leg swelling.    He does complain of chronic diarrhea. Patient has been to Gastroenterologist in the past. He is unsure if any food contributes to diarrhea.    Patient notes that he has been taking metamucil daily.    FmHx of dad has prostate cancer.   Patient notes he has cut down smoking.    SUMMARY OF ONCOLOGIC HISTORY: Oncology History   No problem history exists.    INTERVAL HISTORY: Frank Huerta is a 43 y.o. 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  he was last seen by me on 07/04/2024; at the time he did not have any concerns and was doing well.   Today, he reports feeling well. He is 9 months sober and is no longer consuming alcohol . He reports some regular bloating but he attributes this to his dessert consumption.   He is on 1cc (100mg ) of testosterone  per week. He reports that he has a CBC done every 6 weeks at [facility]. He  consumes mushroom coffee, fish oil, vitamin D3 at 4000 units per day, vitamin B-12, protein powder and pre-workout, essential fatty acids, milk thistle, elderberry, and melatonin.   He reports needing to take naps every day. He is still taking Cialis and his libido has improved since being on testosterone ..  He is feeling well and denies any other concerns.    REVIEW OF SYSTEMS:   10 Point review of systems of done and is negative except as noted above.  MEDICAL HISTORY Past Medical History:  Diagnosis Date   Attention deficit disorder (ADD)    Cocaine abuse (HCC)    GAD (generalized anxiety disorder)    Hemorrhoids    History of condyloma acuminatum 11/2003   urethral    SURGICAL HISTORY Past Surgical History:  Procedure Laterality Date   URETHROPLASTY  12/12/2003   @[facility]  by dr s. chales;   meatotomy,  excision wart distal urethral, cystoscopy, and urethroplasty    SOCIAL HISTORY Social History[1]  Social History   Social History Narrative   Not on file    SOCIAL DRIVERS OF HEALTH SDOH Screenings   Depression (PHQ2-9): Low Risk (07/19/2024)  Tobacco Use: High Risk (07/07/2023)     FAMILY HISTORY No family history on file.   ALLERGIES: has no known allergies.  MEDICATIONS  Current Outpatient Medications  Medication Sig Dispense Refill   tadalafil (CIALIS) 20 MG tablet Take 20 mg by mouth daily as needed.     valACYclovir  (VALTREX ) 1000 MG tablet Take 1 tablet (1,000 mg total) by mouth 2 (two)  times daily. (Patient taking differently: Take 1,000 mg by mouth as needed.) 10 tablet 2   No current facility-administered medications for this visit.    PHYSICAL EXAMINATION: ECOG PERFORMANCE STATUS: 1 - Symptomatic but completely ambulatory VITALS: Vitals:   10/06/24 1434 10/06/24 1438  BP: (!) 157/76 136/71  Pulse: 83   Resp: 17   Temp: 97.8 F (36.6 C)   SpO2: 98%    Filed Weights   10/06/24 1434  Weight: 230 lb (104.3 kg)   Body mass index is 28  kg/m.  GENERAL: alert, in no acute distress and comfortable SKIN: no acute rashes, no significant lesions EYES: conjunctiva are pink and non-injected, sclera anicteric OROPHARYNX: MMM, no exudates, no oropharyngeal erythema or ulceration NECK: supple, no JVD LYMPH:  no palpable lymphadenopathy in the cervical, axillary or inguinal regions LUNGS: clear to auscultation b/l with normal respiratory effort HEART: regular rate & rhythm ABDOMEN:  normoactive bowel sounds , non tender, not distended, no hepatosplenomegaly Extremity: no pedal edema PSYCH: alert & oriented x 3 with fluent speech NEURO: no focal motor/sensory deficits  LABORATORY DATA:   I have reviewed the data as listed     Latest Ref Rng & Units 10/04/2024    2:10 PM 07/19/2024    1:37 PM 06/21/2024    2:20 PM  CBC EXTENDED  WBC 4.0 - 10.5 K/uL 7.0  6.1  5.7   RBC 4.22 - 5.81 MIL/uL 5.86  5.61  5.98   Hemoglobin 13.0 - 17.0 g/dL 81.2  82.4  81.2   HCT 39.0 - 52.0 % 51.7  47.9  51.4   Platelets 150 - 400 K/uL 191  161  158   NEUT# 1.7 - 7.7 K/uL 4.0  3.4  2.8   Lymph# 0.7 - 4.0 K/uL 2.2  2.1  2.2        Latest Ref Rng & Units 10/04/2024    2:10 PM 07/19/2024    1:37 PM 06/21/2024    2:20 PM  CMP  Glucose 70 - 99 mg/dL 873  98  97   BUN 6 - 20 mg/dL 16  19  18    Creatinine 0.61 - 1.24 mg/dL 8.62  8.90  8.89   Sodium 135 - 145 mmol/L 138  139  138   Potassium 3.5 - 5.1 mmol/L 3.9  4.1  4.1   Chloride 98 - 111 mmol/L 104  108  107   CO2 22 - 32 mmol/L 24  25  24    Calcium 8.9 - 10.3 mg/dL 9.6  9.5  89.9   Total Protein 6.5 - 8.1 g/dL 6.8  7.0  7.0   Total Bilirubin 0.0 - 1.2 mg/dL 2.8  2.2  1.7   Alkaline Phos 38 - 126 U/L 54  58  58   AST 15 - 41 U/L 30  25  28    ALT 0 - 44 U/L 33  32  38    Iron/TIBC/Ferritin/ %Sat    Component Value Date/Time   IRON 129 10/04/2024 1410   TIBC 322 10/04/2024 1410   FERRITIN 36 10/04/2024 1411   IRONPCTSAT 40 (H) 10/04/2024 1410     RADIOGRAPHIC STUDIES: I have  personally reviewed the radiological images as listed and agreed with the findings in the report. No results found.  ASSESSMENT & PLAN:   [name] is a 43 y.o. male who presents for evaluation of hereditary hemochromatosis. Hereditary hemochromatosis is a hereditary condition caused by mutations in the HFE gene, which regulates  iron absorption. The most common genes mutated in this condition are the C282Y and H63D genes. Based on outside testing, Mr. Villalva has evidence of homozygous mutation of C282Y gene.Homozygous mutations represent a disease state which requires phlebotomy to decrease ferritin levels to a goal of <50  (Blood (2010) 116 (3): 317-325). The goal is to decrease ferritin so there is no deposition in critical organs (liver, heart, pancreas and thyroid ).    #Hereditary Hemochromatosis-homozygous mutation of C282Y gene --Started weekly phlebotomies on 06/14/2023 with goal ferritin <50.  --Outside [place]  liver from 02/05/2023 showed hepatomegaly with fatty liver infiltration. No evidence of iron deposition.  --Echo showed no evidence of iron deposition. EF 65-60%. --Liver biopsy from 07/07/2023. 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     Hemochromatosis Ferritin levels have significantly decreased from 7000 to 102 after 14 weeks of weekly phlebotomies. Liver function tests have normalized. Patient has ceased alcohol  consumption.   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 -Encourage continued abstinence from alcohol .  He has gone through rehabilitation program and is motivated to stay off the alcohol .  Patient has reached 6 months of sobriety   General Health  Maintenance -Advise patient to check the iron content of any pre-workout supplements. -Consider potential effects of caffeine and other ingredients in pre-workout supplements on overall health.     PLAN: - Discussed lab results on 10/06/2024 in detail with patient: -Ferritin is 36, under 50. From a Ferratin standpoint, no phlebotomy needed -Liver enzymes are normal,  -Alkaline phosphate -Hemoglobin 18.7, which could also be due to testosterone  supplementing -Bilirubin levels are increased, could be due to his testosterone  supplementing   -At risk of VTE from polycythemia related to testosterone .  -counseled to see PCP or Endocrinologist to administer testosterone  replacement and monitor this closely. -Recommends seeing an endocrinologist for testosterone   -RTC in 3-4 months   FOLLOW-UP in 3-4 months for labs and follow-up with Dr. Onesimo.  The total time spent in the appointment was 23 minutes* .  All of the patient's questions were answered and the patient knows to call the clinic with any problems, questions, or concerns.  Emaline Onesimo MD MS AAHIVMS [facility] Hematology/Oncology Physician [facility]  *Total Encounter Time as defined by the Centers for Medicare and Medicaid Services includes, in addition to the face-to-face time of a patient visit (documented in the note above) non-face-to-face time: obtaining and reviewing outside history, ordering and reviewing medications, tests or procedures, care coordination (communications with other health care professionals or caregivers) and documentation in the medical record.  I, Alan Blowers, acting as a neurosurgeon for Emaline Onesimo, MD.,have documented all relevant documentation on the behalf of Emaline Onesimo, MD,as directed by  Emaline Onesimo, MD while in the presence of Emaline Onesimo, MD.  I have reviewed the above documentation for accuracy and completeness, and I agree with the above.  Emaline Onesimo, MD     [1]  Social  History Tobacco Use   Smoking status: Every Day    Types: E-cigarettes   Smokeless tobacco: Never  Vaping Use   Vaping status: Every Day  Substance Use Topics   Alcohol  use: Yes    Comment: three times weekly   Drug use: Yes  Types: Cocaine

## 2024-10-12 ENCOUNTER — Encounter: Payer: Self-pay | Admitting: Physician Assistant

## 2024-10-13 DIAGNOSIS — F411 Generalized anxiety disorder: Secondary | ICD-10-CM | POA: Diagnosis not present

## 2024-10-15 LAB — MISC LABCORP TEST (SEND OUT): Labcorp test code: 489615
